# Patient Record
Sex: Female | Born: 1955 | Race: White | Hispanic: No | Marital: Married | Smoking: Never smoker
Health system: Southern US, Community
[De-identification: ages and names within clinical notes are randomized; demographics above are authoritative.]

## PROBLEM LIST (undated history)

## (undated) DIAGNOSIS — F329 Major depressive disorder, single episode, unspecified: Secondary | ICD-10-CM

## (undated) DIAGNOSIS — H269 Unspecified cataract: Secondary | ICD-10-CM

## (undated) DIAGNOSIS — C801 Malignant (primary) neoplasm, unspecified: Secondary | ICD-10-CM

## (undated) DIAGNOSIS — M419 Scoliosis, unspecified: Secondary | ICD-10-CM

## (undated) DIAGNOSIS — D649 Anemia, unspecified: Secondary | ICD-10-CM

## (undated) DIAGNOSIS — E559 Vitamin D deficiency, unspecified: Secondary | ICD-10-CM

## (undated) DIAGNOSIS — K449 Diaphragmatic hernia without obstruction or gangrene: Secondary | ICD-10-CM

## (undated) DIAGNOSIS — M4800 Spinal stenosis, site unspecified: Secondary | ICD-10-CM

## (undated) DIAGNOSIS — H9209 Otalgia, unspecified ear: Secondary | ICD-10-CM

## (undated) DIAGNOSIS — K829 Disease of gallbladder, unspecified: Secondary | ICD-10-CM

## (undated) DIAGNOSIS — R079 Chest pain, unspecified: Secondary | ICD-10-CM

## (undated) DIAGNOSIS — E042 Nontoxic multinodular goiter: Secondary | ICD-10-CM

## (undated) DIAGNOSIS — R05 Cough: Secondary | ICD-10-CM

## (undated) DIAGNOSIS — M7989 Other specified soft tissue disorders: Secondary | ICD-10-CM

## (undated) DIAGNOSIS — M5030 Other cervical disc degeneration, unspecified cervical region: Secondary | ICD-10-CM

## (undated) DIAGNOSIS — F32A Depression, unspecified: Secondary | ICD-10-CM

## (undated) DIAGNOSIS — M8580 Other specified disorders of bone density and structure, unspecified site: Secondary | ICD-10-CM

## (undated) DIAGNOSIS — G4730 Sleep apnea, unspecified: Secondary | ICD-10-CM

## (undated) DIAGNOSIS — M549 Dorsalgia, unspecified: Secondary | ICD-10-CM

## (undated) DIAGNOSIS — F419 Anxiety disorder, unspecified: Secondary | ICD-10-CM

## (undated) DIAGNOSIS — R059 Cough, unspecified: Secondary | ICD-10-CM

## (undated) DIAGNOSIS — K589 Irritable bowel syndrome without diarrhea: Secondary | ICD-10-CM

## (undated) DIAGNOSIS — E871 Hypo-osmolality and hyponatremia: Secondary | ICD-10-CM

## (undated) DIAGNOSIS — K5900 Constipation, unspecified: Secondary | ICD-10-CM

## (undated) DIAGNOSIS — N3010 Interstitial cystitis (chronic) without hematuria: Secondary | ICD-10-CM

## (undated) DIAGNOSIS — K219 Gastro-esophageal reflux disease without esophagitis: Secondary | ICD-10-CM

## (undated) DIAGNOSIS — M719 Bursopathy, unspecified: Secondary | ICD-10-CM

## (undated) DIAGNOSIS — M1990 Unspecified osteoarthritis, unspecified site: Secondary | ICD-10-CM

## (undated) DIAGNOSIS — I10 Essential (primary) hypertension: Secondary | ICD-10-CM

## (undated) HISTORY — DX: Interstitial cystitis (chronic) without hematuria: N30.10

## (undated) HISTORY — DX: Anemia, unspecified: D64.9

## (undated) HISTORY — DX: Unspecified cataract: H26.9

## (undated) HISTORY — DX: Scoliosis, unspecified: M41.9

## (undated) HISTORY — PX: DG SCOLIOSIS SERIES (ARMC HX): HXRAD1605

## (undated) HISTORY — DX: Spinal stenosis, site unspecified: M48.00

## (undated) HISTORY — DX: Sleep apnea, unspecified: G47.30

## (undated) HISTORY — DX: Malignant (primary) neoplasm, unspecified: C80.1

## (undated) HISTORY — DX: Depression, unspecified: F32.A

## (undated) HISTORY — PX: CHOLECYSTECTOMY: SHX55

## (undated) HISTORY — PX: TYMPANOSTOMY TUBE PLACEMENT: SHX32

## (undated) HISTORY — DX: Hypo-osmolality and hyponatremia: E87.1

## (undated) HISTORY — DX: Diaphragmatic hernia without obstruction or gangrene: K44.9

## (undated) HISTORY — DX: Anxiety disorder, unspecified: F41.9

## (undated) HISTORY — DX: Irritable bowel syndrome, unspecified: K58.9

## (undated) HISTORY — DX: Other specified soft tissue disorders: M79.89

## (undated) HISTORY — DX: Cough, unspecified: R05.9

## (undated) HISTORY — PX: COLOSTOMY CLOSURE: SHX1381

## (undated) HISTORY — DX: Constipation, unspecified: K59.00

## (undated) HISTORY — DX: Disease of gallbladder, unspecified: K82.9

## (undated) HISTORY — DX: Other specified disorders of bone density and structure, unspecified site: M85.80

## (undated) HISTORY — DX: Dorsalgia, unspecified: M54.9

## (undated) HISTORY — DX: Unspecified osteoarthritis, unspecified site: M19.90

## (undated) HISTORY — DX: Essential (primary) hypertension: I10

## (undated) HISTORY — DX: Major depressive disorder, single episode, unspecified: F32.9

## (undated) HISTORY — DX: Other cervical disc degeneration, unspecified cervical region: M50.30

## (undated) HISTORY — PX: PELVIC ABCESS DRAINAGE: SHX2189

## (undated) HISTORY — DX: Gastro-esophageal reflux disease without esophagitis: K21.9

## (undated) HISTORY — PX: COLOSTOMY: SHX63

## (undated) HISTORY — DX: Vitamin D deficiency, unspecified: E55.9

## (undated) HISTORY — PX: OTHER SURGICAL HISTORY: SHX169

## (undated) HISTORY — DX: Otalgia, unspecified ear: H92.09

## (undated) HISTORY — DX: Nontoxic multinodular goiter: E04.2

## (undated) HISTORY — DX: Bursopathy, unspecified: M71.9

## (undated) HISTORY — PX: TONSILECTOMY, ADENOIDECTOMY, BILATERAL MYRINGOTOMY AND TUBES: SHX2538

## (undated) HISTORY — PX: NASAL SEPTUM SURGERY: SHX37

## (undated) HISTORY — DX: Cough: R05

## (undated) HISTORY — PX: INTERSTIM IMPLANT PLACEMENT: SHX5130

## (undated) HISTORY — PX: HERNIA REPAIR: SHX51

## (undated) HISTORY — PX: COLOSTOMY REVERSAL: SHX5782

## (undated) HISTORY — DX: Chest pain, unspecified: R07.9

## (undated) HISTORY — PX: APPENDECTOMY: SHX54

---

## 2014-03-29 ENCOUNTER — Other Ambulatory Visit: Payer: Self-pay | Admitting: Orthopedic Surgery

## 2014-03-29 DIAGNOSIS — M5416 Radiculopathy, lumbar region: Secondary | ICD-10-CM

## 2014-04-03 ENCOUNTER — Other Ambulatory Visit: Payer: Self-pay | Admitting: Orthopedic Surgery

## 2014-04-03 DIAGNOSIS — M4316 Spondylolisthesis, lumbar region: Secondary | ICD-10-CM

## 2014-04-05 ENCOUNTER — Ambulatory Visit
Admission: RE | Admit: 2014-04-05 | Discharge: 2014-04-05 | Disposition: A | Discharge status: Home / Self Care | Payer: BC Managed Care – PPO | Source: Ambulatory Visit | Attending: Orthopedic Surgery | Admitting: Orthopedic Surgery

## 2014-04-05 DIAGNOSIS — M4316 Spondylolisthesis, lumbar region: Secondary | ICD-10-CM

## 2014-05-08 ENCOUNTER — Other Ambulatory Visit: Payer: Self-pay | Admitting: Orthopedic Surgery

## 2014-05-08 DIAGNOSIS — M5416 Radiculopathy, lumbar region: Secondary | ICD-10-CM

## 2014-05-31 ENCOUNTER — Other Ambulatory Visit: Payer: Self-pay | Admitting: Family Medicine

## 2014-05-31 ENCOUNTER — Ambulatory Visit
Admission: RE | Admit: 2014-05-31 | Discharge: 2014-05-31 | Disposition: A | Discharge status: Home / Self Care | Payer: BC Managed Care – PPO | Source: Ambulatory Visit | Attending: Orthopedic Surgery | Admitting: Orthopedic Surgery

## 2014-05-31 DIAGNOSIS — M5416 Radiculopathy, lumbar region: Secondary | ICD-10-CM

## 2014-05-31 MED ORDER — METHYLPREDNISOLONE ACETATE 40 MG/ML INJ SUSP (RADIOLOG
120.0000 mg | Freq: Once | INTRAMUSCULAR | Status: AC
  Administered 2014-05-31: 120 mg via EPIDURAL

## 2014-05-31 MED ORDER — IOHEXOL 180 MG/ML  SOLN
1.0000 mL | Freq: Once | INTRAMUSCULAR | Status: AC | PRN
  Administered 2014-05-31: 1 mL via EPIDURAL

## 2014-05-31 NOTE — Discharge Instructions (Signed)

## 2016-05-28 ENCOUNTER — Ambulatory Visit (INDEPENDENT_AMBULATORY_CARE_PROVIDER_SITE_OTHER): Payer: BC Managed Care – PPO | Admitting: Licensed Clinical Social Worker

## 2016-05-28 DIAGNOSIS — M1990 Unspecified osteoarthritis, unspecified site: Secondary | ICD-10-CM | POA: Insufficient documentation

## 2016-05-28 DIAGNOSIS — I10 Essential (primary) hypertension: Secondary | ICD-10-CM | POA: Insufficient documentation

## 2016-05-28 DIAGNOSIS — F418 Other specified anxiety disorders: Secondary | ICD-10-CM

## 2016-05-28 DIAGNOSIS — J45909 Unspecified asthma, uncomplicated: Secondary | ICD-10-CM | POA: Insufficient documentation

## 2016-05-28 DIAGNOSIS — K219 Gastro-esophageal reflux disease without esophagitis: Secondary | ICD-10-CM | POA: Insufficient documentation

## 2016-05-28 NOTE — Progress Notes (Signed)
Comprehensive Clinical Assessment (CCA) Note  05/28/2016 Jenna Chang [id number]  Visit Diagnosis:      ICD-9-CM ICD-10-CM   1. Situational anxiety 300.09 F41.8       CCA Part One  Part One has been completed on paper by the patient.  (See scanned document in Chart Review)  CCA Part Two A  Intake/Chief Complaint:  CCA Intake With Chief Complaint CCA Part Two Date: 05/28/16 CCA Part Two Time: 1206 Chief Complaint/Presenting Problem: "I worry about my daughter." Patients Currently Reported Symptoms/Problems: Patient has suspected her daughter, Jenna Chang (31) has bipolar disorder.  She has never been diagnosed, but her father has it.    Patient notes "It's emotionally draining to deal with her emotional outbursts."   Sometimes when she is stressed she overeats.       Collateral Involvement: Patient's husband, Bryn Gulling was also present for the assessment Individual's Strengths: Caring, likes to do things for others  Supportive husband and friends Type of Services Patient Feels Are Needed: NOt sure Initial Clinical Notes/Concerns: No history of MH problems  Mental Health Symptoms Depression:  Depression: Increase/decrease in appetite  Mania:  Mania: N/A  Anxiety:   Anxiety: Worrying, Tension  Psychosis:  Psychosis: N/A  Trauma:  Trauma: N/A  Obsessions:  Obsessions: N/A  Compulsions:  Compulsions: N/A  Inattention:  Inattention: N/A  Hyperactivity/Impulsivity:  Hyperactivity/Impulsivity: N/A  Oppositional/Defiant Behaviors:  Oppositional/Defiant Behaviors: N/A  Borderline Personality:  Emotional Irregularity: N/A  Other Mood/Personality Symptoms:      Mental Status Exam Appearance and self-care  Stature:  Stature: Average  Weight:  Weight: Overweight  Clothing:  Clothing: Neat/clean  Grooming:  Grooming: Normal  Cosmetic use:  Cosmetic Use: Age appropriate  Posture/gait:  Posture/Gait: Normal  Motor activity:  Motor Activity: Not Remarkable  Sensorium  Attention:   Attention: Normal  Concentration:  Concentration: Normal  Orientation:  Orientation: X5  Recall/memory:  Recall/Memory: Normal  Affect and Mood  Affect:  Affect: Appropriate  Mood:  Mood: Euthymic  Relating  Eye contact:  Eye Contact: Normal  Facial expression:  Facial Expression: Responsive  Attitude toward examiner:  Attitude Toward Examiner: Cooperative  Thought and Language  Speech flow: Speech Flow: Normal  Thought content:     Preoccupation:     Hallucinations:     Organization:     Transport planner of Knowledge:  Fund of Knowledge: Average  Intelligence:  Intelligence: Average  Abstraction:  Abstraction: Normal  Judgement:  Judgement: Normal  Reality Testing:  Reality Testing: Adequate  Insight:  Insight: Good  Decision Making:  Decision Making: Normal  Social Functioning  Social Maturity:  Social Maturity: Responsible  Social Judgement:  Social Judgement: Normal  Stress  Stressors:  Stressors: Family conflict  Coping Ability:  Coping Ability: Normal  Skill Deficits:     Supports:      Family and Psychosocial History: Family history Marital status: Married (Previously married for 13 years to [place].  He has bipolar disorder.  ) Number of Years Married: 1 Additional relationship information: Together since Oct 2015.  They knew eachother for about 15 years.   Does patient have children?: Yes How many children?: 2 How is patient's relationship with their children?: Son (34)-lives in [place], good relationship, talk frequently  Daughter, (31)-  relationship can be very good, or the opposite  Childhood History:  Childhood History By whom was/is the patient raised?: Both parents Additional childhood history information: Had scoliosis Description of patient's relationship with caregiver when they were a child: "  i had the best parents ever" Patient's description of current relationship with people who raised him/her: Dad died 10-18-2012.    Mom died Feb 18, 2015. 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 Has patient ever been in the [employer]?: No  Education: Education Did Teacher, adult education From [employer]?: Yes Did Physicist, medical?: Yes Did Heritage manager?: Yes What is Your Teacher, English as a foreign language Degree?: Special Education Teaching Did You Have Any Difficulty At [employer]?: No  Religion: Religion/Spirituality Are You A Religious Person?: Yes (Would like to find a new church) What is Your Religious Affiliation?: Personal assistant: Leisure / Recreation Leisure and Hobbies: Likes to read, spend time with family, travel   Exercise/Diet: Exercise/Diet Do You Exercise?: No Have You Gained or Lost A Significant Amount of Weight in the Past Six Months?: No Do You Follow a Special Diet?: No Do You Have Any Trouble Sleeping?: No  CCA Part Two C  Alcohol/Drug Use: Alcohol / Drug Use History of alcohol / drug use?: No history of alcohol / drug abuse                      CCA Part Three  ASAM's:  Six Dimensions of Multidimensional Assessment  Dimension 1:  Acute Intoxication and/or Withdrawal Potential:     Dimension 2:  Biomedical Conditions and Complications:     Dimension 3:  Emotional, Behavioral, or Cognitive Conditions and Complications:      Dimension 4:  Readiness to Change:     Dimension 5:  Relapse, Continued use, or Continued Problem Potential:     Dimension 6:  Recovery/Living Environment:      Substance use Disorder (SUD)    Social Function:  Social Functioning Social Maturity: Responsible Social Judgement: Normal  Stress:  Stress Stressors: Family conflict Coping Ability: Normal Patient Takes Medications The Way The Doctor Instructed?: Yes  Risk Assessment- Self-Harm Potential: Risk Assessment For Self-Harm Potential Thoughts of Self-Harm: No current thoughts Additional Comments for Self-Harm Potential: Denies history of harm to self  Risk Assessment -Dangerous to Others Potential: Risk Assessment For Dangerous to Others Potential Method: No Plan Additional Comments for Danger to Others Potential: Denies history of harm to others  DSM5 Diagnoses: Patient Active Problem List   Diagnosis Date Noted  . Asthma 05/28/2016  . Benign essential hypertension 05/28/2016  . GERD (gastroesophageal reflux disease) 05/28/2016  . Osteoarthritis 05/28/2016      Recommendations for Services/Supports/Treatments: Recommendations for Services/Supports/Treatments Recommendations For Services/Supports/Treatments:  (At patient's request provided her with the titles and authors of books about bipolar disorder)   No MH services recommended at this time.  Patient appears to be coping with life stressors effectively and she has sources of support in her life.    Garnette Scheuermann

## 2017-02-08 ENCOUNTER — Encounter (INDEPENDENT_AMBULATORY_CARE_PROVIDER_SITE_OTHER): Payer: BC Managed Care – PPO

## 2017-03-07 ENCOUNTER — Encounter (INDEPENDENT_AMBULATORY_CARE_PROVIDER_SITE_OTHER): Payer: Self-pay | Admitting: Family Medicine

## 2017-03-07 ENCOUNTER — Ambulatory Visit (INDEPENDENT_AMBULATORY_CARE_PROVIDER_SITE_OTHER): Payer: BC Managed Care – PPO | Admitting: Family Medicine

## 2017-03-07 VITALS — BP 153/96 | HR 68 | Temp 97.8°F | Ht 63.0 in | Wt 232.0 lb

## 2017-03-07 DIAGNOSIS — D649 Anemia, unspecified: Secondary | ICD-10-CM | POA: Insufficient documentation

## 2017-03-07 DIAGNOSIS — Z6841 Body Mass Index (BMI) 40.0 and over, adult: Secondary | ICD-10-CM

## 2017-03-07 DIAGNOSIS — D508 Other iron deficiency anemias: Secondary | ICD-10-CM

## 2017-03-07 DIAGNOSIS — I10 Essential (primary) hypertension: Secondary | ICD-10-CM

## 2017-03-07 DIAGNOSIS — E559 Vitamin D deficiency, unspecified: Secondary | ICD-10-CM | POA: Diagnosis not present

## 2017-03-07 DIAGNOSIS — R9431 Abnormal electrocardiogram [ECG] [EKG]: Secondary | ICD-10-CM | POA: Diagnosis not present

## 2017-03-07 DIAGNOSIS — Z1331 Encounter for screening for depression: Secondary | ICD-10-CM | POA: Diagnosis not present

## 2017-03-07 DIAGNOSIS — R5383 Other fatigue: Secondary | ICD-10-CM | POA: Insufficient documentation

## 2017-03-07 DIAGNOSIS — R0602 Shortness of breath: Secondary | ICD-10-CM

## 2017-03-07 DIAGNOSIS — Z0289 Encounter for other administrative examinations: Secondary | ICD-10-CM

## 2017-03-07 NOTE — Progress Notes (Signed)
.  Office: [phone]  /  Fax: [phone]   HPI:   Chief Complaint: OBESITY  Jenna Chang (MR# [id number]) is a 61 y.o. female who presents on 03/07/2017 for obesity evaluation and treatment. Current BMI is Body mass index is 41.1 kg/m.Marland Kitchen Jenna Chang has struggled with obesity for years and has been unsuccessful in either losing weight or maintaining long term weight loss. Jenna Chang attended our information session and states she is currently in the action stage of change and ready to dedicate time achieving and maintaining a healthier weight.   Jenna Chang states her family eats meals together she thinks her family will eat healthier with  her her desired weight loss is 92 lbs she started gaining weight after her first marriage separation her heaviest weight ever was 232 lbs. she frequently makes poor food choices she has problems with excessive hunger  she frequently eats larger portions than normal  she struggles with emotional eating    Fatigue Aviela feels her energy is lower than it should be. This has worsened with weight gain and has not worsened recently. Aiysha admits to daytime somnolence and  admits to waking up still tired. Patient is at risk for obstructive sleep apnea. Patent has a history of symptoms of morning headache. Patient generally gets 7 hours of sleep per night, and states they generally have difficulty falling asleep. Snoring is not present since she started using a CPAP. Apneic episodes are present and she has had a sleep study. Epworth Sleepiness Score is 5.  Dyspnea on exertion Jenna Chang notes increasing shortness of breath with exercising and seems to be worsening over time with weight gain. She notes getting out of breath sooner with activity than she used to. This has not gotten worse recently. Chyanna denies orthopnea.  Hypertension Jenna Chang is a 61 y.o. female with hypertension. Jenna Chang denies chest pain. She is working weight loss to help control her  blood pressure with the goal of decreasing her risk of heart attack and stroke. Jenna Chang's blood pressure is elevated today and she didn'Jenna Chang take her medication, as she is fasting.  Vitamin D deficiency Jenna Chang has a diagnosis of vitamin D deficiency. She is not currently taking Vit D and she notes fatigue but denies nausea, vomiting or muscle weakness.  Iron Deficiency Anemia Jenna Chang has a diagnosis of iron deficiency anemia.  She notes that her levels have not been checked in a while and notes fatigue. She is not on iron supplementation.   Abnormal EKG Jenna Chang was seen by cardiology and she states "nothing was wrong". She denies chest pain but notes shortness of breath with activity.  Depression Screen Jenna Chang's Food and Chang (modified PHQ-9) score was  Depression screen PHQ 2/9 03/07/2017  Decreased Interest 3  Down, Depressed, Hopeless 0  PHQ - 2 Score 3  Altered sleeping 3  Tired, decreased energy 3  Change in appetite 2  Feeling bad or failure about yourself  1  Trouble concentrating 0  Moving slowly or fidgety/restless 2  Suicidal thoughts 0  PHQ-9 Score 14  Difficult doing work/chores Somewhat difficult  Some encounter information is confidential and restricted. Go to Review Flowsheets activity to see all data.    ALLERGIES: Allergies  Allergen Reactions  . Trovafloxacin Anaphylaxis, Itching and Swelling    IV dosing only Tolerates levaquin without problems.  . Cefdinir Nausea And Vomiting  . Amlodipine Swelling  . Amlodipine Besylate Swelling  . Hydrochlorothiazide   . Losartan Cough  . Lisinopril Cough  MEDICATIONS: Current Outpatient Prescriptions on File Prior to Visit  Medication Sig Dispense Refill  . cetirizine (ZYRTEC) 10 MG tablet Take by mouth.    . cholecalciferol (VITAMIN D) 1000 units tablet Take by mouth 2 (two) times daily.     Marland Kitchen HYDROcodone-acetaminophen (NORCO) 7.5-325 MG tablet Take 1 tablet by mouth every 6 (six) hours as needed.     Marland Kitchen  levocetirizine (XYZAL) 5 MG tablet Take 5 mg by mouth.    . mirabegron ER (MYRBETRIQ) 50 MG TB24 tablet Take by mouth.    . pantoprazole (PROTONIX) 40 MG tablet Take by mouth 2 (two) times daily.     . traZODone (DESYREL) 50 MG tablet Take 1-2 tablets nightly at hour of sleep    . valACYclovir (VALTREX) 1000 MG tablet take 1 tablet by mouth twice a day if needed     No current facility-administered medications on file prior to visit.     PAST MEDICAL HISTORY: Past Medical History:  Diagnosis Date  . Acid reflux   . Anemia   . Anxiety   . Back pain   . Bilateral swelling of feet   . Bursitis   . Cataract   . Chest pain   . Constipation   . Cough   . DDD (degenerative disc disease), cervical   . Depression   . Ear pain   . Gallbladder disease   . GERD (gastroesophageal reflux disease)   . Hiatal hernia   . HTN (hypertension)   . IBS (irritable bowel syndrome)   . IC (interstitial cystitis)   . Low sodium levels   . Multinodular goiter   . OA (osteoarthritis)   . Osteopenia   . Scoliosis   . Sleep apnea   . Spinal stenosis   . Vitamin D deficiency     PAST SURGICAL HISTORY: Past Surgical History:  Procedure Laterality Date  . APPENDECTOMY    . CESAREAN SECTION    . CHOLECYSTECTOMY    . COLOSTOMY    . COLOSTOMY CLOSURE    . COLOSTOMY REVERSAL    . DG SCOLIOSIS SERIES ([place] [facility])     rods placed  . HERNIA REPAIR    . INTERSTIM IMPLANT PLACEMENT    . NASAL SEPTUM SURGERY    . PELVIC ABCESS DRAINAGE    . TONSILECTOMY, ADENOIDECTOMY, BILATERAL MYRINGOTOMY AND TUBES    . TYMPANOSTOMY TUBE PLACEMENT    . vascular leg      SOCIAL HISTORY: Social History  Substance Use Topics  . Smoking status: Not on file  . Smokeless tobacco: Not on file  . Alcohol use Not on file    FAMILY HISTORY: Family History  Problem Relation Age of Onset  . Hypertension Mother   . Heart disease Mother   . Stroke Mother   . Cancer Mother   . Anxiety disorder Mother   . Sleep  apnea Mother   . Hyperlipidemia Father   . Hypertension Father   . Heart disease Father   . Thyroid disease Father   . Kidney disease Father   . Cancer Father     ROS: Review of Systems  Constitutional: Positive for malaise/fatigue. Negative for weight loss.       Weakness Trouble sleeping (sometimes)  HENT: Positive for ear pain.        Stuffiness (year around) Discharge (year around) Difficult or painful swallowing (esophagus stretched 9/20) Hoarseness (sometimes)  Eyes:       Vision changes (uncreased far sightedness)  Wear glasses  Respiratory: Positive for cough and shortness of breath (with exertion).   Cardiovascular: Negative for chest pain and orthopnea.       Calf/leg pain with walking Leg cramping Very cold feet or hands  Gastrointestinal: Positive for heartburn. Negative for nausea and vomiting.       Swallowing difficulty  Genitourinary: Positive for frequency.       Excessive thirst  Musculoskeletal: Positive for back pain and neck pain.       Muscle or joint pain Negative muscle weakness  Skin: Positive for rash.       Hair or nail changes (black lines underneath nails)  Neurological: Positive for headaches.  Endo/Heme/Allergies: Bruises/bleeds easily.  Psychiatric/Behavioral: Positive for depression (situational).    PHYSICAL EXAM: Blood pressure (!) 153/96, pulse 68, temperature 97.8 F (36.6 C), temperature source Oral, height 5\' 3"  (1.6 m), weight 232 lb (105.2 kg), SpO2 96 %. Body mass index is 41.1 kg/m. Physical Exam  Constitutional: She is oriented to person, place, and time. She appears well-developed and well-nourished.  Cardiovascular:  Murmur (grade 1/6 early systolic murmur) heard. Pulmonary/Chest: Effort normal.  Musculoskeletal: Normal range of motion.  Neurological: She is oriented to person, place, and time.  Skin: Skin is warm and dry.  Psychiatric: She has a normal Chang and affect.  Vitals reviewed.   RECENT LABS AND  TESTS: BMET No results found for: NA, K, CL, CO2, GLUCOSE, BUN, CREATININE, CALCIUM, GFRNONAA, GFRAA No results found for: HGBA1C No results found for: INSULIN CBC No results found for: WBC, RBC, HGB, HCT, PLT, MCV, MCH, MCHC, RDW, LYMPHSABS, MONOABS, EOSABS, BASOSABS Iron/TIBC/Ferritin/ %Sat No results found for: IRON, TIBC, FERRITIN, IRONPCTSAT Lipid Panel  No results found for: CHOL, TRIG, HDL, CHOLHDL, VLDL, LDLCALC, LDLDIRECT Hepatic Function Panel  No results found for: PROT, ALBUMIN, AST, ALT, ALKPHOS, BILITOT, BILIDIR, IBILI No results found for: TSH  ECG  shows NSR with a rate of 63 BPM INDIRECT CALORIMETER done today shows a VO2 of 249 and a REE of 1736. Her calculated basal metabolic rate is 1324 thus her basal metabolic rate is better than expected.    ASSESSMENT AND PLAN: Other fatigue - Plan: EKG 12-Lead, Vitamin B12, Hemoglobin A1c, Insulin, random, Folate, T3, TSH, T4, free  Shortness of breath on exertion  Essential hypertension - Plan: Comprehensive metabolic panel  Vitamin D deficiency - Plan: VITAMIN D 25 Hydroxy (Vit-D Deficiency, Fractures)  Other iron deficiency anemia - Plan: CBC With Differential, Anemia panel  Abnormal ECG - Plan: Lipid Panel With LDL/HDL Ratio  Depression screening  Class 3 severe obesity with serious comorbidity and body mass index (BMI) of 40.0 to 44.9 in adult, unspecified obesity type (HCC)  PLAN:  Fatigue Sebastiana was informed that her fatigue may be related to obesity, depression or many other causes. Labs will be ordered, and in the meanwhile Meigan has agreed to work on diet, exercise and weight loss to help with fatigue. Proper sleep hygiene was discussed including the need for 7-8 hours of quality sleep each night. A sleep study was not ordered based on symptoms and Epworth score.  Dyspnea on exertion Jenna Chang's shortness of breath appears to be obesity related and exercise induced. She has agreed to work on weight loss  and gradually increase exercise to treat her exercise induced shortness of breath. If Jenna Chang follows our instructions and loses weight without improvement of her shortness of breath, we will plan to refer to pulmonology. We will monitor this condition regularly. Jenna Chang agrees to this  plan.  Hypertension We discussed sodium restriction, working on healthy weight loss, diet, and a regular exercise program as the means to achieve improved blood pressure control. Jenna Chang agreed with this plan and agreed to follow up as directed. We will continue to monitor her blood pressure as well as her progress with the above lifestyle modifications. She will continue her medications as prescribed and will watch for signs of hypotension as she continues her lifestyle modifications. We will check labs and Jenna Chang agrees to follow up with our clinic in 2 weeks.  Vitamin D Deficiency Jenna Chang was informed that low vitamin D levels contributes to fatigue and are associated with obesity, breast, and colon cancer. She will follow up for routine testing of vitamin D, at least 2-3 times per year. She was informed of the risk of over-replacement of vitamin D and agrees to not increase her dose unless he discusses this with [place] first. We will check labs and Jenna Chang agrees to follow up with our clinic in 2 weeks.  Iron Deficiency Anemia The diagnosis of Iron deficiency anemia was discussed with Jenna Chang and was explained in detail. She was given suggestions of iron rich foods and and iron supplement was not prescribed. We will check labs and Jenna Chang agrees to follow up with our clinic in 2 weeks.  Abnormal EKG We will obtain medical records from [facility] and Jenna Chang agrees to follow up with our clinic in 2 weeks.  Depression Screen Jenna Chang had a moderately positive depression screening. Depression is commonly associated with obesity and often results in emotional eating behaviors. We will monitor this closely and work on CBT to  help improve the non-hunger eating patterns. Referral to Psychology may be required if no improvement is seen as she continues in our clinic.  Obesity Jenna Chang is currently in the action stage of change and her goal is to continue with weight loss efforts She has agreed to follow the Category 3 plan Jenna Chang has been instructed to work up to a goal of 150 minutes of combined cardio and strengthening exercise per week for weight loss and overall health benefits. We discussed the following Behavioral Modification Strategies today: increasing lean protein intake, decreasing simple carbohydrates, and work on meal planning and easy cooking plans  Maurice has agreed to follow up with our clinic in 2 weeks. She was informed of the importance of frequent follow up visits to maximize her success with intensive lifestyle modifications for her multiple health conditions. She was informed we would discuss her lab results at her next visit unless there is a critical issue that needs to be addressed sooner. Ishitha agreed to keep her next visit at the agreed upon time to discuss these results.  I, Trixie Dredge, am acting as transcriptionist for Dennard Nip, MD  I have reviewed the above documentation for accuracy and completeness, and I agree with the above. -Dennard Nip, MD       Today's visit was # 1 out of 22.  Starting weight: 232 lbs Starting date: 03/07/17 Today's weight : 232 lbs Today's date: 03/07/2017 Total lbs lost to date: 0 (Patients must lose 7 lbs in the first 6 months to continue with counseling)   ASK: We discussed the diagnosis of obesity with Jenna Chang today and Renesmay agreed to give [place] permission to discuss obesity behavioral modification therapy today.  ASSESS: Ivianna has the diagnosis of obesity and her BMI today is 41.11 Mabelle is in the action stage of change   ADVISE: Shereena was  educated on the multiple health risks of obesity as well as the benefit of weight loss  to improve her health. She was advised of the need for long term treatment and the importance of lifestyle modifications.  AGREE: Multiple dietary modification options and treatment options were discussed and  Maira agreed to follow the Category 3 plan We discussed the following Behavioral Modification Strategies today: increasing lean protein intake, decreasing simple carbohydrates, and work on meal planning and easy cooking plans

## 2017-03-08 LAB — COMPREHENSIVE METABOLIC PANEL
A/G RATIO: 1.7 (ref 1.2–2.2)
ALBUMIN: 3.8 g/dL (ref 3.6–4.8)
ALT: 12 IU/L (ref 0–32)
AST: 12 IU/L (ref 0–40)
Alkaline Phosphatase: 107 IU/L (ref 39–117)
BUN/Creatinine Ratio: 14 (ref 12–28)
BUN: 9 mg/dL (ref 8–27)
Bilirubin Total: 0.4 mg/dL (ref 0.0–1.2)
CO2: 26 mmol/L (ref 20–29)
Calcium: 9.2 mg/dL (ref 8.7–10.3)
Chloride: 95 mmol/L — ABNORMAL LOW (ref 96–106)
Creatinine, Ser: 0.64 mg/dL (ref 0.57–1.00)
GFR, EST AFRICAN AMERICAN: 112 mL/min/{1.73_m2} (ref >59)
GFR, EST NON AFRICAN AMERICAN: 97 mL/min/{1.73_m2} (ref >59)
Globulin, Total: 2.2 g/dL (ref 1.5–4.5)
Glucose: 88 mg/dL (ref 65–99)
POTASSIUM: 4.9 mmol/L (ref 3.5–5.2)
Sodium: 133 mmol/L — ABNORMAL LOW (ref 134–144)
Total Protein: 6 g/dL (ref 6.0–8.5)

## 2017-03-08 LAB — FOLATE

## 2017-03-08 LAB — CBC WITH DIFFERENTIAL
BASOS: 0 %
Basophils Absolute: 0 10*3/uL (ref 0.0–0.2)
EOS (ABSOLUTE): 0.2 10*3/uL (ref 0.0–0.4)
EOS: 3 %
Hemoglobin: 12.8 g/dL (ref 11.1–15.9)
Immature Grans (Abs): 0 10*3/uL (ref 0.0–0.1)
Immature Granulocytes: 0 %
LYMPHS ABS: 1.7 10*3/uL (ref 0.7–3.1)
LYMPHS: 27 %
MCH: 29.2 pg (ref 26.6–33.0)
MCHC: 33.4 g/dL (ref 31.5–35.7)
MCV: 87 fL (ref 79–97)
MONOS ABS: 0.3 10*3/uL (ref 0.1–0.9)
Monocytes: 5 %
NEUTROS PCT: 65 %
Neutrophils Absolute: 4 10*3/uL (ref 1.4–7.0)
RBC: 4.39 x10E6/uL (ref 3.77–5.28)
RDW: 13.8 % (ref 12.3–15.4)
WBC: 6.2 10*3/uL (ref 3.4–10.8)

## 2017-03-08 LAB — TSH: TSH: 3.44 u[IU]/mL (ref 0.450–4.500)

## 2017-03-08 LAB — ANEMIA PANEL
FOLATE, RBC: 1457 ng/mL (ref >498)
Ferritin: 17 ng/mL (ref 15–150)
Folate, Hemolysate: 558.2 ng/mL
Hematocrit: 38.3 % (ref 34.0–46.6)
IRON SATURATION: 18 % (ref 15–55)
IRON: 73 ug/dL (ref 27–159)
RETIC CT PCT: 1.5 % (ref 0.6–2.6)
TIBC: 401 ug/dL (ref 250–450)
UIBC: 328 ug/dL (ref 131–425)
Vitamin B-12: 1679 pg/mL — ABNORMAL HIGH (ref 232–1245)

## 2017-03-08 LAB — T4, FREE: Free T4: 1.3 ng/dL (ref 0.82–1.77)

## 2017-03-08 LAB — INSULIN, RANDOM: INSULIN: 7.8 u[IU]/mL (ref 2.6–24.9)

## 2017-03-08 LAB — HEMOGLOBIN A1C
Est. average glucose Bld gHb Est-mCnc: 103 mg/dL
Hgb A1c MFr Bld: 5.2 % (ref 4.8–5.6)

## 2017-03-08 LAB — LIPID PANEL WITH LDL/HDL RATIO
Cholesterol, Total: 183 mg/dL (ref 100–199)
HDL: 67 mg/dL (ref >39)
LDL Calculated: 98 mg/dL (ref 0–99)
LDl/HDL Ratio: 1.5 ratio (ref 0.0–3.2)
TRIGLYCERIDES: 89 mg/dL (ref 0–149)
VLDL Cholesterol Cal: 18 mg/dL (ref 5–40)

## 2017-03-08 LAB — VITAMIN D 25 HYDROXY (VIT D DEFICIENCY, FRACTURES): VIT D 25 HYDROXY: 60.7 ng/mL (ref 30.0–100.0)

## 2017-03-08 LAB — T3: T3, Total: 125 ng/dL (ref 71–180)

## 2017-03-21 ENCOUNTER — Ambulatory Visit (INDEPENDENT_AMBULATORY_CARE_PROVIDER_SITE_OTHER): Payer: BC Managed Care – PPO | Admitting: Family Medicine

## 2017-03-22 ENCOUNTER — Ambulatory Visit (INDEPENDENT_AMBULATORY_CARE_PROVIDER_SITE_OTHER): Payer: BC Managed Care – PPO | Admitting: Family Medicine

## 2017-03-22 VITALS — BP 109/74 | HR 70 | Temp 97.8°F | Ht 63.0 in | Wt 227.0 lb

## 2017-03-22 DIAGNOSIS — F509 Eating disorder, unspecified: Secondary | ICD-10-CM

## 2017-03-22 DIAGNOSIS — Z6841 Body Mass Index (BMI) 40.0 and over, adult: Secondary | ICD-10-CM

## 2017-03-22 DIAGNOSIS — R7989 Other specified abnormal findings of blood chemistry: Secondary | ICD-10-CM | POA: Insufficient documentation

## 2017-03-22 DIAGNOSIS — E8881 Metabolic syndrome: Secondary | ICD-10-CM

## 2017-03-22 MED ORDER — BUPROPION HCL ER (SR) 150 MG PO TB12: 150.0000 mg | ORAL_TABLET | Freq: Every day | ORAL | 0 refills | Status: DC

## 2017-03-22 NOTE — Progress Notes (Signed)
Office: [phone]  /  Fax: [phone]   HPI:   Chief Complaint: OBESITY Jenna Chang is here to discuss her progress with her obesity treatment plan. She is on the Category 3 plan and is following her eating plan approximately 95 % of the time. She states she is walking for 15 minutes 2 times per week. Jenna Chang continues to do well on the Category 3 plan but she is getting ready to travel and then go on vacation and she is worried about weight gain.  Her weight is 227 lb (103 kg) today and has had a weight loss of 5 pounds over a period of 2 weeks since her last visit. She has lost 5 lbs since starting treatment with [place].  Eating Disorder (Not otherwise Specified) Jatara notes she is struggling with emotional eating and cravings. She mostly eats for pleasure and not for hunger. She describes some binge eating and has an unhealthy relationship with food and her weight.  Elevated Vitamin B12  Mazelle has a diagnosis of elevated B12. She is on OTC Vitamin B12 and her level is now over-replaced. She denies nausea and vomiting.   Insulin Resistance Suvi has a diagnosis of insulin resistance based on her mildly elevated fasting insulin level >5 with normal glucose and A1c. Although Jailen's blood glucose readings are still under good control, insulin resistance puts her at greater risk of metabolic syndrome and diabetes. She is not taking metformin currently and continues to work on diet and exercise to decrease risk of diabetes. She notes polyphagia.  ALLERGIES: Allergies  Allergen Reactions  . Trovafloxacin Anaphylaxis, Itching and Swelling    IV dosing only Tolerates levaquin without problems.  . Cefdinir Nausea And Vomiting  . Amlodipine Swelling  . Amlodipine Besylate Swelling  . Hydrochlorothiazide   . Losartan Cough  . Lisinopril Cough    MEDICATIONS: Current Outpatient Medications on File Prior to Visit  Medication Sig Dispense Refill  . albuterol (PROVENTIL HFA;VENTOLIN HFA)  108 (90 Base) MCG/ACT inhaler Inhale into the lungs every 6 (six) hours as needed for wheezing or shortness of breath.    . brimonidine (ALPHAGAN) 0.2 % ophthalmic solution 3 (three) times daily.    . calcium-vitamin D 250-100 MG-UNIT tablet Take 1 tablet by mouth 2 (two) times daily.    . carvedilol (COREG) 6.25 MG tablet Take 12.5 mg 2 (two) times daily with a meal by mouth.     . cetirizine (ZYRTEC) 10 MG tablet Take by mouth.    . cholecalciferol (VITAMIN D) 1000 units tablet Take by mouth 2 (two) times daily.     . cyclobenzaprine (FLEXERIL) 5 MG tablet Take 5 mg by mouth 3 (three) times daily as needed for muscle spasms.    Marland Kitchen desmopressin (DDAVP) 0.2 MG tablet Take 0.2 mg by mouth daily.    . fluticasone (FLONASE) 50 MCG/ACT nasal spray Place 1 spray into both nostrils daily.    . furosemide (LASIX) 20 MG tablet Take 20 mg by mouth daily.    Marland Kitchen HYDROcodone-acetaminophen (NORCO) 7.5-325 MG tablet Take 1 tablet by mouth every 6 (six) hours as needed.     Marland Kitchen ipratropium (ATROVENT) 0.03 % nasal spray Place 2 sprays into both nostrils every 12 (twelve) hours.    Marland Kitchen levocetirizine (XYZAL) 5 MG tablet Take 5 mg by mouth.    . mirabegron ER (MYRBETRIQ) 50 MG TB24 tablet Take by mouth.    . Ospemifene (OSPHENA) 60 MG TABS Take 1 tablet by mouth daily.    Marland Kitchen  pantoprazole (PROTONIX) 40 MG tablet Take by mouth 2 (two) times daily.     . polycarbophil (FIBERCON) 625 MG tablet Take 1,250 mg by mouth daily.    . traZODone (DESYREL) 50 MG tablet Take 1-2 tablets nightly at hour of sleep    . valACYclovir (VALTREX) 1000 MG tablet take 1 tablet by mouth twice a day if needed    . verapamil (CALAN) 120 MG tablet Take 240 mg once by mouth.      No current facility-administered medications on file prior to visit.     PAST MEDICAL HISTORY: Past Medical History:  Diagnosis Date  . Acid reflux   . Anemia   . Anxiety   . Back pain   . Bilateral swelling of feet   . Bursitis   . Cataract   . Chest pain     . Constipation   . Cough   . DDD (degenerative disc disease), cervical   . Depression   . Ear pain   . Gallbladder disease   . GERD (gastroesophageal reflux disease)   . Hiatal hernia   . HTN (hypertension)   . IBS (irritable bowel syndrome)   . IC (interstitial cystitis)   . Low sodium levels   . Multinodular goiter   . OA (osteoarthritis)   . Osteopenia   . Scoliosis   . Sleep apnea   . Spinal stenosis   . Vitamin D deficiency     PAST SURGICAL HISTORY: Past Surgical History:  Procedure Laterality Date  . APPENDECTOMY    . CESAREAN SECTION    . CHOLECYSTECTOMY    . COLOSTOMY    . COLOSTOMY CLOSURE    . COLOSTOMY REVERSAL    . DG SCOLIOSIS SERIES ([facility])     rods placed  . HERNIA REPAIR    . INTERSTIM IMPLANT PLACEMENT    . NASAL SEPTUM SURGERY    . PELVIC ABCESS DRAINAGE    . TONSILECTOMY, ADENOIDECTOMY, BILATERAL MYRINGOTOMY AND TUBES    . TYMPANOSTOMY TUBE PLACEMENT    . vascular leg      SOCIAL HISTORY: Social History   Tobacco Use  . Smoking status: Not on file  Substance Use Topics  . Alcohol use: Not on file  . Drug use: Not on file    FAMILY HISTORY: Family History  Problem Relation Age of Onset  . Hypertension Mother   . Heart disease Mother   . Stroke Mother   . Cancer Mother   . Anxiety disorder Mother   . Sleep apnea Mother   . Hyperlipidemia Father   . Hypertension Father   . Heart disease Father   . Thyroid disease Father   . Kidney disease Father   . Cancer Father     ROS: Review of Systems  Constitutional: Positive for weight loss.  Endo/Heme/Allergies:       Positive polyphagia  Psychiatric/Behavioral:       Eating disorder    PHYSICAL EXAM: Blood pressure 109/74, pulse 70, temperature 97.8 F (36.6 C), temperature source Oral, height 5\' 3"  (1.6 m), weight 227 lb (103 kg), SpO2 98 %. Body mass index is 40.21 kg/m. Physical Exam  Constitutional: She is oriented to person, place, and time. She appears  well-developed and well-nourished.  Cardiovascular: Normal rate.  Pulmonary/Chest: Effort normal.  Musculoskeletal: Normal range of motion.  Neurological: She is oriented to person, place, and time.  Skin: Skin is warm and dry.  Psychiatric: She has a normal mood and affect.  Vitals  reviewed.   RECENT LABS AND TESTS: BMET    Component Value Date/Time   NA 133 (L) 03/07/2017 0930   K 4.9 03/07/2017 0930   CL 95 (L) 03/07/2017 0930   CO2 26 03/07/2017 0930   GLUCOSE 88 03/07/2017 0930   BUN 9 03/07/2017 0930   CREATININE 0.64 03/07/2017 0930   CALCIUM 9.2 03/07/2017 0930   GFRNONAA 97 03/07/2017 0930   GFRAA 112 03/07/2017 0930   Lab Results  Component Value Date   HGBA1C 5.2 03/07/2017   Lab Results  Component Value Date   INSULIN 7.8 03/07/2017   CBC    Component Value Date/Time   WBC 6.2 03/07/2017 0930   RBC 4.39 03/07/2017 0930   HGB 12.8 03/07/2017 0930   HCT 38.3 03/07/2017 0930   MCV 87 03/07/2017 0930   MCH 29.2 03/07/2017 0930   MCHC 33.4 03/07/2017 0930   RDW 13.8 03/07/2017 0930   LYMPHSABS 1.7 03/07/2017 0930   EOSABS 0.2 03/07/2017 0930   BASOSABS 0.0 03/07/2017 0930   Iron/TIBC/Ferritin/ %Sat    Component Value Date/Time   IRON 73 03/07/2017 0930   TIBC 401 03/07/2017 0930   FERRITIN 17 03/07/2017 0930   IRONPCTSAT 18 03/07/2017 0930   Lipid Panel     Component Value Date/Time   CHOL 183 03/07/2017 0930   TRIG 89 03/07/2017 0930   HDL 67 03/07/2017 0930   LDLCALC 98 03/07/2017 0930   Hepatic Function Panel     Component Value Date/Time   PROT 6.0 03/07/2017 0930   ALBUMIN 3.8 03/07/2017 0930   AST 12 03/07/2017 0930   ALT 12 03/07/2017 0930   ALKPHOS 107 03/07/2017 0930   BILITOT 0.4 03/07/2017 0930      Component Value Date/Time   TSH 3.440 03/07/2017 0930    ASSESSMENT AND PLAN: High serum vitamin B12  Insulin resistance  Eating disorder, unspecified  Class 3 severe obesity with serious comorbidity and body mass  index (BMI) of 40.0 to 44.9 in adult, unspecified obesity type ([place])  PLAN:  Eating Disorder (Not otherwise Specified) We discussed behavior modification techniques today to help Sanoe deal with her emotional eating. Jerzy agrees to start Wellbutrin SR 150 mg q AM #30 with no refills. Reghan agrees to follow up with our clinic in 2 weeks with our dietitian and follow up in 4 weeks with myself.  Elevated Vitamin B12 Ashia agrees to discontinue OTC Vitamin B12. We will recheck labs in 3 months and she will continue her high B12 diet. Francille agrees to follow up with our clinic in 2 weeks with our dietitian and follow up in 4 weeks with myself.  Insulin Resistance Belkys will continue to work on weight loss, diet, exercise, and decreasing simple carbohydrates in her diet to help decrease the risk of diabetes. We dicussed metformin including benefits and risks. She was informed that eating too many simple carbohydrates or too many calories at one sitting increases the likelihood of GI side effects. Giovanni declined metformin for now and prescription was not written today. We will recheck labs in 3 months. Veatrice agrees to follow up with our clinic in 2 weeks with our dietitian and follow up in 4 weeks with myself as directed to monitor her progress.  Obesity Ronin is currently in the action stage of change. As such, her goal is to continue with weight loss efforts She has agreed to follow the Category 3 plan Sherriann has been instructed to work up to a goal of  150 minutes of combined cardio and strengthening exercise per week for weight loss and overall health benefits. We discussed the following Behavioral Modification Strategies today: increasing lean protein intake, emotional eating strategies, decrease ETOH, increase H20 intake, keeping healthy foods in the home, and travel eating strategies.   Jodean has agreed to follow up with our clinic in 2 weeks with our dietitian and follow up in 4 weeks  with myself. She was informed of the importance of frequent follow up visits to maximize her success with intensive lifestyle modifications for her multiple health conditions.  I, Trixie Dredge, am acting as transcriptionist for Dennard Nip, MD  I have reviewed the above documentation for accuracy and completeness, and I agree with the above. -Dennard Nip, MD      Today's visit was # 2 out of 65.  Starting weight: 232 lbs Starting date: 03/07/17 Today's weight : 227 lbs  Today's date: 03/22/2017 Total lbs lost to date: 5 (Patients must lose 7 lbs in the first 6 months to continue with counseling)   ASK: We discussed the diagnosis of obesity with [name] today and Annaliyah agreed to give [place] permission to discuss obesity behavioral modification therapy today.  ASSESS: Tawania has the diagnosis of obesity and her BMI today is 40.22 Yenty is in the action stage of change   ADVISE: Khalise was educated on the multiple health risks of obesity as well as the benefit of weight loss to improve her health. She was advised of the need for long term treatment and the importance of lifestyle modifications.  AGREE: Multiple dietary modification options and treatment options were discussed and  Kensley agreed to follow the Category 3 plan We discussed the following Behavioral Modification Strategies today: increasing lean protein intake, emotional eating strategies, decrease ETOH, increase H20 intake, keeping healthy foods in the home, and travel eating strategies.

## 2017-04-05 ENCOUNTER — Ambulatory Visit (INDEPENDENT_AMBULATORY_CARE_PROVIDER_SITE_OTHER): Payer: BC Managed Care – PPO | Admitting: Dietician

## 2017-04-05 VITALS — Ht 63.0 in | Wt 222.0 lb

## 2017-04-05 DIAGNOSIS — Z6839 Body mass index (BMI) 39.0-39.9, adult: Secondary | ICD-10-CM

## 2017-04-05 DIAGNOSIS — Z9189 Other specified personal risk factors, not elsewhere classified: Secondary | ICD-10-CM | POA: Diagnosis not present

## 2017-04-05 DIAGNOSIS — E8881 Metabolic syndrome: Secondary | ICD-10-CM | POA: Diagnosis not present

## 2017-04-08 ENCOUNTER — Encounter (INDEPENDENT_AMBULATORY_CARE_PROVIDER_SITE_OTHER): Payer: Self-pay | Admitting: Family Medicine

## 2017-04-11 NOTE — Progress Notes (Signed)
  Office: [phone]  /  Fax: [phone]     Jenna Chang has a diagnosis of insulin resistance based on her elevated fasting insulin level >5. Although Jenna Chang's blood glucose readings are still under good control, insulin resistance puts her at greater risk of metabolic syndrome and diabetes. Jenna Chang is here for diabetic risk nutrition counseling.  Her weight today is 222 lbs. She has had a weight loss of 5 lbs since her last visit and has lost 10 lbs since beginning treatment with [place]. She is following our Category 3 meal plan 99% of the time. She states she was on vacation and planned for the category 3 which she states worked out very well. She denies any significant problems with cravings and states her hunger has been manageable. Today healthy eating for the Thanksgiving holiday was discussed in detail as well as  food nutrients ie protein, fats, simple and complex carbohydrates and how these affect insulin response. Focus on portion control,  avoiding simple carbohydrates and lower fat foods for ongoing wt loss efforts.   Jenna Chang is on the following meal plan: category 3 Her meal plan was individualized for maximum benefit.  Also discussed at length the following behavioral modifications to help maximize  success holiday eating strategies.   Jenna Chang has been instructed to work up to a goal of 150 minutes of combined cardio and strengthening exercise per week for weight loss and overall health benefits. Written information was provided and the following handouts were given: Holiday eating.   Office: [phone]  /  Fax: [phone]  OBESITY BEHAVIORAL INTERVENTION VISIT  Today's visit was # 3 out of 22.  Starting weight: 232 lbs Starting date: 03/07/17 Today's weight : Weight: 222 lb (100.7 kg)  Today's date:04/05/17 Total lbs lost to date: 10 (Patients must lose 7 lbs in the first 6 months to continue with counseling)   ASK: We discussed the diagnosis of obesity with Jenna Chang  today and Jenna Chang agreed to give [place] permission to discuss obesity behavioral modification therapy today.  ASSESS: Jenna Chang has the diagnosis of obesity and her BMI today is 39.34. Jenna Chang is in the action stage of change   ADVISE: Jenna Chang was educated on the multiple health risks of obesity as well as the benefit of weight loss to improve her health. She was advised of the need for long term treatment and the importance of lifestyle modifications.  AGREE: Multiple dietary modification options and treatment options were discussed and  Jenna Chang agreed to follow the Category 3 plan We discussed the following Behavioral Modification Stratagies today: increasing lean protein intake and holiday eating strategies

## 2017-04-18 ENCOUNTER — Ambulatory Visit (INDEPENDENT_AMBULATORY_CARE_PROVIDER_SITE_OTHER): Payer: BC Managed Care – PPO | Admitting: Family Medicine

## 2017-04-18 VITALS — BP 109/71 | HR 72 | Temp 98.2°F | Ht 63.0 in | Wt 221.0 lb

## 2017-04-18 DIAGNOSIS — Z6839 Body mass index (BMI) 39.0-39.9, adult: Secondary | ICD-10-CM

## 2017-04-18 DIAGNOSIS — F3289 Other specified depressive episodes: Secondary | ICD-10-CM

## 2017-04-18 DIAGNOSIS — E8881 Metabolic syndrome: Secondary | ICD-10-CM

## 2017-04-18 MED ORDER — BUPROPION HCL ER (SR) 200 MG PO TB12: 200.0000 mg | ORAL_TABLET | Freq: Every day | ORAL | 0 refills | Status: DC

## 2017-04-18 NOTE — Progress Notes (Signed)
Office: [phone]  /  Fax: [phone]   HPI:   Chief Complaint: OBESITY Jenna Chang is here to discuss her progress with her obesity treatment plan. She is on the Category 3 plan and is following her eating plan approximately 95 % of the time. She states she is walking for 30 minutes 3 to 4 times per week. Jenna Chang continues to do well with weight loss, even over Thanksgiving. She states hunger is mostly controlled and she is doing well with exercise. Her weight is 221 lb (100.2 kg) today and has had a weight loss of 1 pound over a period of 4 weeks since her last visit. She has lost 11 lbs since starting treatment with [place].  Insulin Resistance Jenna Chang has a diagnosis of insulin resistance based on her elevated fasting insulin level >5. Although Jenna Chang's blood glucose readings are still under good control, insulin resistance puts her at greater risk of metabolic syndrome and diabetes. Jenna Chang was struggling to follow the plan over Thanksgiving with increased simple carbohydrates, but she tried to control her portions. Jenna Chang is not taking metformin currently and she continues to work on diet and exercise to decrease risk of diabetes.  Depression with emotional eating behaviors Jenna Chang is tearful in the office today about only losing 1 pound over Thanksgiving, even though she had to deviate from her plan with recent TMJ diagnosis. Jenna Chang struggles with emotional eating and using food for comfort to the extent that it is negatively impacting her health. She often snacks when she is not hungry. Jenna Chang sometimes feels she is out of control and then feels guilty that she made poor food choices. She has been working on behavior modification techniques to help reduce her emotional eating and has been somewhat successful. She shows no sign of suicidal or homicidal ideations.  Depression screen PHQ 2/9 03/07/2017  Decreased Interest 3  Down, Depressed, Hopeless 0  PHQ - 2 Score 3  Altered sleeping 3    Tired, decreased energy 3  Change in appetite 2  Feeling bad or failure about yourself  1  Trouble concentrating 0  Moving slowly or fidgety/restless 2  Suicidal thoughts 0  PHQ-9 Score 14  Difficult doing work/chores Somewhat difficult  Some encounter information is confidential and restricted. Go to Review Flowsheets activity to see all data.      ALLERGIES: Allergies  Allergen Reactions  . Trovafloxacin Anaphylaxis, Itching and Swelling    IV dosing only Tolerates levaquin without problems.  . Cefdinir Nausea And Vomiting  . Amlodipine Swelling  . Amlodipine Besylate Swelling  . Hydrochlorothiazide   . Losartan Cough  . Lisinopril Cough    MEDICATIONS: Current Outpatient Medications on File Prior to Visit  Medication Sig Dispense Refill  . albuterol (PROVENTIL HFA;VENTOLIN HFA) 108 (90 Base) MCG/ACT inhaler Inhale into the lungs every 6 (six) hours as needed for wheezing or shortness of breath.    . brimonidine (ALPHAGAN) 0.2 % ophthalmic solution 3 (three) times daily.    . calcium-vitamin D 250-100 MG-UNIT tablet Take 1 tablet by mouth 2 (two) times daily.    . carvedilol (COREG) 6.25 MG tablet Take 12.5 mg 2 (two) times daily with a meal by mouth.     . cetirizine (ZYRTEC) 10 MG tablet Take by mouth.    . cholecalciferol (VITAMIN D) 1000 units tablet Take by mouth 2 (two) times daily.     . cyclobenzaprine (FLEXERIL) 5 MG tablet Take 5 mg by mouth 3 (three) times daily as needed for muscle  spasms.    Marland Kitchen desmopressin (DDAVP) 0.2 MG tablet Take 0.2 mg by mouth daily.    . fluticasone (FLONASE) 50 MCG/ACT nasal spray Place 1 spray into both nostrils daily.    . furosemide (LASIX) 20 MG tablet Take 20 mg by mouth daily.    Marland Kitchen HYDROcodone-acetaminophen (NORCO) 7.5-325 MG tablet Take 1 tablet by mouth every 6 (six) hours as needed.     Marland Kitchen ipratropium (ATROVENT) 0.03 % nasal spray Place 2 sprays into both nostrils every 12 (twelve) hours.    Marland Kitchen levocetirizine (XYZAL) 5 MG  tablet Take 5 mg by mouth.    . mirabegron ER (MYRBETRIQ) 50 MG TB24 tablet Take by mouth.    . Ospemifene (OSPHENA) 60 MG TABS Take 1 tablet by mouth daily.    . pantoprazole (PROTONIX) 40 MG tablet Take by mouth 2 (two) times daily.     . polycarbophil (FIBERCON) 625 MG tablet Take 1,250 mg by mouth daily.    . traZODone (DESYREL) 50 MG tablet Take 1-2 tablets nightly at hour of sleep    . valACYclovir (VALTREX) 1000 MG tablet take 1 tablet by mouth twice a Jenna Chang if needed    . verapamil (CALAN) 120 MG tablet Take 240 mg once by mouth.      No current facility-administered medications on file prior to visit.     PAST MEDICAL HISTORY: Past Medical History:  Diagnosis Date  . Acid reflux   . Anemia   . Anxiety   . Back pain   . Bilateral swelling of feet   . Bursitis   . Cataract   . Chest pain   . Constipation   . Cough   . DDD (degenerative disc disease), cervical   . Depression   . Ear pain   . Gallbladder disease   . GERD (gastroesophageal reflux disease)   . Hiatal hernia   . HTN (hypertension)   . IBS (irritable bowel syndrome)   . IC (interstitial cystitis)   . Low sodium levels   . Multinodular goiter   . OA (osteoarthritis)   . Osteopenia   . Scoliosis   . Sleep apnea   . Spinal stenosis   . Vitamin D deficiency     PAST SURGICAL HISTORY: Past Surgical History:  Procedure Laterality Date  . APPENDECTOMY    . CESAREAN SECTION    . CHOLECYSTECTOMY    . COLOSTOMY    . COLOSTOMY CLOSURE    . COLOSTOMY REVERSAL    . DG SCOLIOSIS SERIES ([place] HX)     rods placed  . HERNIA REPAIR    . INTERSTIM IMPLANT PLACEMENT    . NASAL SEPTUM SURGERY    . PELVIC ABCESS DRAINAGE    . TONSILECTOMY, ADENOIDECTOMY, BILATERAL MYRINGOTOMY AND TUBES    . TYMPANOSTOMY TUBE PLACEMENT    . vascular leg      SOCIAL HISTORY: Social History   Tobacco Use  . Smoking status: Not on file  Substance Use Topics  . Alcohol use: Not on file  . Drug use: Not on file    FAMILY  HISTORY: Family History  Problem Relation Age of Onset  . Hypertension Mother   . Heart disease Mother   . Stroke Mother   . Cancer Mother   . Anxiety disorder Mother   . Sleep apnea Mother   . Hyperlipidemia Father   . Hypertension Father   . Heart disease Father   . Thyroid disease Father   . Kidney disease Father   .  Cancer Father     ROS: Review of Systems  Constitutional: Positive for weight loss.  Psychiatric/Behavioral: Positive for depression. Negative for suicidal ideas.    PHYSICAL EXAM: Blood pressure 109/71, pulse 72, temperature 98.2 F (36.8 C), temperature source Oral, height 5\' 3"  (1.6 m), weight 221 lb (100.2 kg), SpO2 96 %. Body mass index is 39.15 kg/m. Physical Exam  Constitutional: She is oriented to person, place, and time. She appears well-developed and well-nourished.  Cardiovascular: Normal rate.  Pulmonary/Chest: Effort normal.  Musculoskeletal: Normal range of motion.  Neurological: She is oriented to person, place, and time.  Skin: Skin is warm and dry.  Vitals reviewed.   RECENT LABS AND TESTS: BMET    Component Value Date/Time   NA 133 (L) 03/07/2017 0930   K 4.9 03/07/2017 0930   CL 95 (L) 03/07/2017 0930   CO2 26 03/07/2017 0930   GLUCOSE 88 03/07/2017 0930   BUN 9 03/07/2017 0930   CREATININE 0.64 03/07/2017 0930   CALCIUM 9.2 03/07/2017 0930   GFRNONAA 97 03/07/2017 0930   GFRAA 112 03/07/2017 0930   Lab Results  Component Value Date   HGBA1C 5.2 03/07/2017   Lab Results  Component Value Date   INSULIN 7.8 03/07/2017   CBC    Component Value Date/Time   WBC 6.2 03/07/2017 0930   RBC 4.39 03/07/2017 0930   HGB 12.8 03/07/2017 0930   HCT 38.3 03/07/2017 0930   MCV 87 03/07/2017 0930   MCH 29.2 03/07/2017 0930   MCHC 33.4 03/07/2017 0930   RDW 13.8 03/07/2017 0930   LYMPHSABS 1.7 03/07/2017 0930   EOSABS 0.2 03/07/2017 0930   BASOSABS 0.0 03/07/2017 0930   Iron/TIBC/Ferritin/ %Sat    Component Value Date/Time    IRON 73 03/07/2017 0930   TIBC 401 03/07/2017 0930   FERRITIN 17 03/07/2017 0930   IRONPCTSAT 18 03/07/2017 0930   Lipid Panel     Component Value Date/Time   CHOL 183 03/07/2017 0930   TRIG 89 03/07/2017 0930   HDL 67 03/07/2017 0930   LDLCALC 98 03/07/2017 0930   Hepatic Function Panel     Component Value Date/Time   PROT 6.0 03/07/2017 0930   ALBUMIN 3.8 03/07/2017 0930   AST 12 03/07/2017 0930   ALT 12 03/07/2017 0930   ALKPHOS 107 03/07/2017 0930   BILITOT 0.4 03/07/2017 0930      Component Value Date/Time   TSH 3.440 03/07/2017 0930    ASSESSMENT AND PLAN: Insulin resistance  Other depression - with emotional eating  Class 2 severe obesity with serious comorbidity and body mass index (BMI) of 39.0 to 39.9 in adult, unspecified obesity type (Jenna Chang)  PLAN:  Insulin Resistance Jenna Chang will continue to work on weight loss, exercise, and decreasing simple carbohydrates in her diet to help decrease the risk of diabetes and we will recheck labs in 1 month. She was informed that eating too many simple carbohydrates or too many calories at one sitting increases the likelihood of GI side effects. Jenna Chang agreed to follow up with [place] as directed to monitor her progress.  Depression with Emotional Eating Behaviors We discussed behavior modification techniques today to help Jenna Chang deal with her emotional eating and depression. She has agreed to increase Wellbutrin SR to 200 mg qam #30 with no refills and will  follow up as directed.  Obesity Jenna Chang is currently in the action stage of change. As such, her goal is to continue with weight loss efforts She has agreed  to follow the Category 3 plan Jenna Chang has been instructed to work up to a goal of 150 minutes of combined cardio and strengthening exercise per week for weight loss and overall health benefits. We discussed the following Behavioral Modification Strategies today: increasing lean protein intake, work on meal planning and  easy cooking plans and holiday eating strategies   Amely has agreed to follow up with our clinic in 2 weeks. She was informed of the importance of frequent follow up visits to maximize her success with intensive lifestyle modifications for her multiple health conditions.  I, Doreene Nest, am acting as transcriptionist for Dennard Nip, MD  I have reviewed the above documentation for accuracy and completeness, and I agree with the above. -Dennard Nip, MD    OBESITY BEHAVIORAL INTERVENTION VISIT  Today's visit was # 3 out of 22.  Starting weight: 232 lbs Starting date: 03/07/17 Today's weight : 221 lbs Today's date: 04/18/2017 Total lbs lost to date: 11 (Patients must lose 7 lbs in the first 6 months to continue with counseling)   ASK: We discussed the diagnosis of obesity with Jenna Chang today and Jenna Chang agreed to give [place] permission to discuss obesity behavioral modification therapy today.  ASSESS: Jenna Chang has the diagnosis of obesity and her BMI today is 39.16 Jenna Chang is in the action stage of change   ADVISE: Jenna Chang was educated on the multiple health risks of obesity as well as the benefit of weight loss to improve her health. She was advised of the need for long term treatment and the importance of lifestyle modifications.  AGREE: Multiple dietary modification options and treatment options were discussed and  Jenna Chang agreed to follow the Category 3 plan We discussed the following Behavioral Modification Strategies today: increasing lean protein intake, work on meal planning and easy cooking plans and holiday eating strategies

## 2017-05-02 ENCOUNTER — Ambulatory Visit (INDEPENDENT_AMBULATORY_CARE_PROVIDER_SITE_OTHER): Payer: BC Managed Care – PPO | Admitting: Family Medicine

## 2017-05-02 VITALS — BP 143/86 | HR 75 | Temp 98.4°F | Ht 63.0 in | Wt 216.0 lb

## 2017-05-02 DIAGNOSIS — I10 Essential (primary) hypertension: Secondary | ICD-10-CM | POA: Diagnosis not present

## 2017-05-02 DIAGNOSIS — Z6838 Body mass index (BMI) 38.0-38.9, adult: Secondary | ICD-10-CM

## 2017-05-02 DIAGNOSIS — F3289 Other specified depressive episodes: Secondary | ICD-10-CM | POA: Diagnosis not present

## 2017-05-02 MED ORDER — BUPROPION HCL ER (SR) 200 MG PO TB12: 200.0000 mg | ORAL_TABLET | Freq: Every day | ORAL | 0 refills | Status: DC

## 2017-05-02 NOTE — Progress Notes (Signed)
Office: [phone]  /  Fax: [phone]   HPI:   Chief Complaint: OBESITY Jenna Chang is here to discuss her progress with her obesity treatment plan. Jenna Chang is on the Category 3 plan and is following her eating plan approximately 100 % of the time. Jenna Chang states Jenna Chang is doing strength training and walking for 30 minutes 3 times per week. Jenna Chang has done well with weight loss but greatly struggling to meet protein goal. Jenna Chang states her total protein at her primary care physician office was low but not recent of actual lab results.  Her weight is 216 lb (98 kg) today and has had a weight loss of 5 pounds over a period of 2 weeks since her last visit. Jenna Chang has lost 16 lbs since starting treatment with [place].  Hypertension Jenna Chang is a 61 y.o. female with hypertension. Jenna Chang's blood pressure is elevated today, normally well controlled. Jenna Chang denies chest pain or pain issues. Jenna Chang is working weight loss to help control her blood pressure with the goal of decreasing her risk of heart attack and stroke. Jenna Chang blood pressure is not currently controlled.  Depression with emotional eating behaviors Jenna Chang's mood is stable on Wellbutrin, Jenna Chang is doing well avoiding emotional eating. Jenna Chang is struggles with emotional eating and using food for comfort to the extent that it is negatively impacting her health. Jenna Chang often snacks when Jenna Chang is not hungry. Jenna Chang sometimes feels Jenna Chang is out of control and then feels guilty that Jenna Chang made poor food choices. Jenna Chang has been working on behavior modification techniques to help reduce her emotional eating and has been somewhat successful. Jenna Chang shows no sign of suicidal or homicidal ideations.  Depression screen PHQ 2/9 03/07/2017  Decreased Interest 3  Down, Depressed, Hopeless 0  PHQ - 2 Score 3  Altered sleeping 3  Tired, decreased energy 3  Change in appetite 2  Feeling bad or failure about yourself  1  Trouble concentrating 0  Moving slowly or fidgety/restless 2    Suicidal thoughts 0  PHQ-9 Score 14  Difficult doing work/chores Somewhat difficult  Some encounter information is confidential and restricted. Go to Review Flowsheets activity to see all data.   ALLERGIES: Allergies  Allergen Reactions  . Trovafloxacin Anaphylaxis, Itching and Swelling    IV dosing only Tolerates levaquin without problems.  . Cefdinir Nausea And Vomiting  . Amlodipine Swelling  . Amlodipine Besylate Swelling  . Hydrochlorothiazide   . Losartan Cough  . Lisinopril Cough    MEDICATIONS: Current Outpatient Medications on File Prior to Visit  Medication Sig Dispense Refill  . albuterol (PROVENTIL HFA;VENTOLIN HFA) 108 (90 Base) MCG/ACT inhaler Inhale into the lungs every 6 (six) hours as needed for wheezing or shortness of breath.    Marland Kitchen amoxicillin-clavulanate (AUGMENTIN) 875-125 MG tablet Take 1 tablet by mouth 2 (two) times daily.    . brimonidine (ALPHAGAN) 0.2 % ophthalmic solution 3 (three) times daily.    Marland Kitchen buPROPion (WELLBUTRIN SR) 200 MG 12 hr tablet Take 1 tablet (200 mg total) by mouth daily at 12 noon. 30 tablet 0  . calcium-vitamin D 250-100 MG-UNIT tablet Take 1 tablet by mouth 2 (two) times daily.    . carvedilol (COREG) 6.25 MG tablet Take 12.5 mg 2 (two) times daily with a meal by mouth.     . cetirizine (ZYRTEC) 10 MG tablet Take by mouth.    . cholecalciferol (VITAMIN D) 1000 units tablet Take by mouth 2 (two) times daily.     . cyclobenzaprine (  FLEXERIL) 5 MG tablet Take 5 mg by mouth 3 (three) times daily as needed for muscle spasms.    Marland Kitchen desmopressin (DDAVP) 0.2 MG tablet Take 0.2 mg by mouth daily.    . fluticasone (FLONASE) 50 MCG/ACT nasal spray Place 1 spray into both nostrils daily.    . furosemide (LASIX) 20 MG tablet Take 20 mg by mouth daily.    Marland Kitchen HYDROcodone-acetaminophen (NORCO) 7.5-325 MG tablet Take 1 tablet by mouth every 6 (six) hours as needed.     Marland Kitchen ipratropium (ATROVENT) 0.03 % nasal spray Place 2 sprays into both nostrils every  12 (twelve) hours.    Marland Kitchen levocetirizine (XYZAL) 5 MG tablet Take 5 mg by mouth.    . mirabegron ER (MYRBETRIQ) 50 MG TB24 tablet Take by mouth.    . Ospemifene (OSPHENA) 60 MG TABS Take 1 tablet by mouth daily.    . pantoprazole (PROTONIX) 40 MG tablet Take by mouth 2 (two) times daily.     . polycarbophil (FIBERCON) 625 MG tablet Take 1,250 mg by mouth daily.    . traZODone (DESYREL) 50 MG tablet Take 1-2 tablets nightly at hour of sleep    . valACYclovir (VALTREX) 1000 MG tablet take 1 tablet by mouth twice a day if needed    . verapamil (CALAN) 120 MG tablet Take 240 mg once by mouth.      No current facility-administered medications on file prior to visit.     PAST MEDICAL HISTORY: Past Medical History:  Diagnosis Date  . Acid reflux   . Anemia   . Anxiety   . Back pain   . Bilateral swelling of feet   . Bursitis   . Cataract   . Chest pain   . Constipation   . Cough   . DDD (degenerative disc disease), cervical   . Depression   . Ear pain   . Gallbladder disease   . GERD (gastroesophageal reflux disease)   . Hiatal hernia   . HTN (hypertension)   . IBS (irritable bowel syndrome)   . IC (interstitial cystitis)   . Low sodium levels   . Multinodular goiter   . OA (osteoarthritis)   . Osteopenia   . Scoliosis   . Sleep apnea   . Spinal stenosis   . Vitamin D deficiency     PAST SURGICAL HISTORY: Past Surgical History:  Procedure Laterality Date  . APPENDECTOMY    . CESAREAN SECTION    . CHOLECYSTECTOMY    . COLOSTOMY    . COLOSTOMY CLOSURE    . COLOSTOMY REVERSAL    . DG SCOLIOSIS SERIES ([place] HX)     rods placed  . HERNIA REPAIR    . INTERSTIM IMPLANT PLACEMENT    . NASAL SEPTUM SURGERY    . PELVIC ABCESS DRAINAGE    . TONSILECTOMY, ADENOIDECTOMY, BILATERAL MYRINGOTOMY AND TUBES    . TYMPANOSTOMY TUBE PLACEMENT    . vascular leg      SOCIAL HISTORY: Social History   Tobacco Use  . Smoking status: Not on file  Substance Use Topics  . Alcohol  use: Not on file  . Drug use: Not on file    FAMILY HISTORY: Family History  Problem Relation Age of Onset  . Hypertension Mother   . Heart disease Mother   . Stroke Mother   . Cancer Mother   . Anxiety disorder Mother   . Sleep apnea Mother   . Hyperlipidemia Father   . Hypertension Father   .  Heart disease Father   . Thyroid disease Father   . Kidney disease Father   . Cancer Father     ROS: Review of Systems  Constitutional: Positive for weight loss.  Cardiovascular: Negative for chest pain.  Psychiatric/Behavioral: Positive for depression. Negative for suicidal ideas.    PHYSICAL EXAM: Blood pressure (!) 143/86, pulse 75, temperature 98.4 F (36.9 C), temperature source Oral, height 5\' 3"  (1.6 m), weight 216 lb (98 kg), SpO2 95 %. Body mass index is 38.26 kg/m. Physical Exam  Constitutional: Jenna Chang is oriented to person, place, and time. Jenna Chang appears well-developed and well-nourished.  Cardiovascular: Normal rate.  Pulmonary/Chest: Effort normal.  Musculoskeletal: Normal range of motion.  Neurological: Jenna Chang is oriented to person, place, and time.  Skin: Skin is warm and dry.  Psychiatric: Jenna Chang has a normal mood and affect.  Vitals reviewed.   RECENT LABS AND TESTS: BMET    Component Value Date/Time   NA 133 (L) 03/07/2017 0930   K 4.9 03/07/2017 0930   CL 95 (L) 03/07/2017 0930   CO2 26 03/07/2017 0930   GLUCOSE 88 03/07/2017 0930   BUN 9 03/07/2017 0930   CREATININE 0.64 03/07/2017 0930   CALCIUM 9.2 03/07/2017 0930   GFRNONAA 97 03/07/2017 0930   GFRAA 112 03/07/2017 0930   Lab Results  Component Value Date   HGBA1C 5.2 03/07/2017   Lab Results  Component Value Date   INSULIN 7.8 03/07/2017   CBC    Component Value Date/Time   WBC 6.2 03/07/2017 0930   RBC 4.39 03/07/2017 0930   HGB 12.8 03/07/2017 0930   HCT 38.3 03/07/2017 0930   MCV 87 03/07/2017 0930   MCH 29.2 03/07/2017 0930   MCHC 33.4 03/07/2017 0930   RDW 13.8 03/07/2017 0930    LYMPHSABS 1.7 03/07/2017 0930   EOSABS 0.2 03/07/2017 0930   BASOSABS 0.0 03/07/2017 0930   Iron/TIBC/Ferritin/ %Sat    Component Value Date/Time   IRON 73 03/07/2017 0930   TIBC 401 03/07/2017 0930   FERRITIN 17 03/07/2017 0930   IRONPCTSAT 18 03/07/2017 0930   Lipid Panel     Component Value Date/Time   CHOL 183 03/07/2017 0930   TRIG 89 03/07/2017 0930   HDL 67 03/07/2017 0930   LDLCALC 98 03/07/2017 0930   Hepatic Function Panel     Component Value Date/Time   PROT 6.0 03/07/2017 0930   ALBUMIN 3.8 03/07/2017 0930   AST 12 03/07/2017 0930   ALT 12 03/07/2017 0930   ALKPHOS 107 03/07/2017 0930   BILITOT 0.4 03/07/2017 0930      Component Value Date/Time   TSH 3.440 03/07/2017 0930    ASSESSMENT AND PLAN: Essential hypertension  Other depression - with emotional eating  - Plan: buPROPion (WELLBUTRIN SR) 200 MG 12 hr tablet  Class 2 severe obesity with serious comorbidity and body mass index (BMI) of 38.0 to 38.9 in adult, unspecified obesity type (Jenna Chang)  PLAN:  Hypertension We discussed sodium restriction, working on healthy weight loss, diet, and a regular exercise program as the means to achieve improved blood pressure control. Jenna Chang agreed with this plan and agreed to follow up as directed. We will continue to monitor her blood pressure as well as her progress with the above lifestyle modifications. Jenna Chang will continue her medications as prescribed and will watch for signs of hypotension as Jenna Chang continues her lifestyle modifications. Jenna Chang agrees to follow up with our clinic in 3 weeks and we will recheck blood pressure at  that time.  Depression with Emotional Eating Behaviors We discussed behavior modification techniques today to help Jenna Chang deal with her emotional eating and depression. Jenna Chang agrees to continue taking Wellbutrin SR 200 mg qd #30 and we will refill for 1 month. Jenna Chang agrees to follow up with our clinic in 3 weeks.  Obesity Jenna Chang is currently  in the action stage of change. As such, her goal is to continue with weight loss efforts Jenna Chang has agreed to follow the Category 3 plan Jenna Chang has been instructed to work up to a goal of 150 minutes of combined cardio and strengthening exercise per week for weight loss and overall health benefits. We discussed the following Behavioral Modification Strategies today: increasing lean protein intake, dealing with family or coworker sabotage, holiday eating strategies, and travel eating strategies    Jenna Chang has agreed to follow up with our clinic in 3 weeks. Jenna Chang was informed of the importance of frequent follow up visits to maximize her success with intensive lifestyle modifications for her multiple health conditions.  I, Jenna Chang, am acting as transcriptionist for Jenna Nip, MD  I have reviewed the above documentation for accuracy and completeness, and I agree with the above. -Jenna Nip, MD     Today's visit was # 4 out of 22.  Starting weight: 232 lbs Starting date: 03/07/17 Today's weight : 216 lbs  Today's date: 05/02/2017 Total lbs lost to date: 16 (Patients must lose 7 lbs in the first 6 months to continue with counseling)   ASK: We discussed the diagnosis of obesity with Jenna Chang today and Jenna Chang agreed to give [place] permission to discuss obesity behavioral modification therapy today.  ASSESS: Camila has the diagnosis of obesity and her BMI today is 38.27 Melia is in the action stage of change   ADVISE: Audray was educated on the multiple health risks of obesity as well as the benefit of weight loss to improve her health. Jenna Chang was advised of the need for long term treatment and the importance of lifestyle modifications.  AGREE: Multiple dietary modification options and treatment options were discussed and  Jonalyn agreed to follow the Category 3 plan We discussed the following Behavioral Modification Strategies today: increasing lean protein intake, dealing with  family or coworker sabotage, holiday eating strategies, and travel eating strategies

## 2017-05-03 ENCOUNTER — Telehealth (INDEPENDENT_AMBULATORY_CARE_PROVIDER_SITE_OTHER): Payer: Self-pay | Admitting: Family Medicine

## 2017-05-03 NOTE — Telephone Encounter (Signed)
Spoke with patient and advised her that I called the pharmacy and they verified that they did receive the prescription for Buproprion, however, it is too early to fill. Advised her that [facility] said it would be able to be filled on the 26th of December.   Donal Lynam

## 2017-05-03 NOTE — Telephone Encounter (Signed)
Ms Depp states that Dr. Leafy Ro was going to send over prescriptions to [facility].  She states she was just at the pharmacy and they are telling her they haven't received anything. Thank you Maudie Mercury

## 2017-05-25 ENCOUNTER — Ambulatory Visit (INDEPENDENT_AMBULATORY_CARE_PROVIDER_SITE_OTHER): Payer: BC Managed Care – PPO | Admitting: Family Medicine

## 2017-05-25 VITALS — BP 123/81 | HR 75 | Temp 98.2°F | Ht 63.0 in | Wt 211.0 lb

## 2017-05-25 DIAGNOSIS — F3289 Other specified depressive episodes: Secondary | ICD-10-CM

## 2017-05-25 DIAGNOSIS — Z6837 Body mass index (BMI) 37.0-37.9, adult: Secondary | ICD-10-CM | POA: Diagnosis not present

## 2017-05-25 MED ORDER — BUPROPION HCL ER (SR) 150 MG PO TB12
150.0000 mg | ORAL_TABLET | Freq: Every day | ORAL | 0 refills | Status: DC
Start: 2017-05-25 — End: 2017-06-16

## 2017-05-25 NOTE — Progress Notes (Signed)
Office: [phone]  /  Fax: [phone]   HPI:   Chief Complaint: OBESITY Jenna Chang is here to discuss her progress with her obesity treatment plan. She is on the Category 3 plan and is following her eating plan approximately 98 % of the time. She states she is doing strength training for 60 minutes 2 times per week. Jenna Chang continues to do well with weight loss even over the holidays and traveling. She is struggling to eat all her protein. Her weight is 211 lb (95.7 kg) today and has had a weight loss of 5 pounds over a period of 3 weeks since her last visit. She has lost 21 lbs since starting treatment with [place].  Depression with emotional eating behaviors Jenna Chang is on Wellbutrin SR 200 mg and she is struggling with ongoing xerostoma. She now has a cavity and attributes this to her dry mouth. Jenna Chang struggles with emotional eating and using food for comfort to the extent that it is negatively impacting her health. She often snacks when she is not hungry. Jenna Chang sometimes feels she is out of control and then feels guilty that she made poor food choices. She has been working on behavior modification techniques to help reduce her emotional eating and has been somewhat successful. She shows no sign of suicidal or homicidal ideations.  Depression screen PHQ 2/9 03/07/2017  Decreased Interest 3  Down, Depressed, Hopeless 0  PHQ - 2 Score 3  Altered sleeping 3  Tired, decreased energy 3  Change in appetite 2  Feeling bad or failure about yourself  1  Trouble concentrating 0  Moving slowly or fidgety/restless 2  Suicidal thoughts 0  PHQ-9 Score 14  Difficult doing work/chores Somewhat difficult  Some encounter information is confidential and restricted. Go to Review Flowsheets activity to see all data.      ALLERGIES: Allergies  Allergen Reactions  . Trovafloxacin Anaphylaxis, Itching and Swelling    IV dosing only Tolerates levaquin without problems.  . Cefdinir Nausea And Vomiting    . Amlodipine Swelling  . Amlodipine Besylate Swelling  . Hydrochlorothiazide   . Losartan Cough  . Lisinopril Cough    MEDICATIONS: Current Outpatient Medications on File Prior to Visit  Medication Sig Dispense Refill  . albuterol (PROVENTIL HFA;VENTOLIN HFA) 108 (90 Base) MCG/ACT inhaler Inhale into the lungs every 6 (six) hours as needed for wheezing or shortness of breath.    . brimonidine (ALPHAGAN) 0.2 % ophthalmic solution 3 (three) times daily.    . calcium-vitamin D 250-100 MG-UNIT tablet Take 1 tablet by mouth 2 (two) times daily.    . carvedilol (COREG) 6.25 MG tablet Take 12.5 mg 2 (two) times daily with a meal by mouth.     . cetirizine (ZYRTEC) 10 MG tablet Take by mouth.    . cholecalciferol (VITAMIN D) 1000 units tablet Take by mouth 2 (two) times daily.     . cyclobenzaprine (FLEXERIL) 5 MG tablet Take 5 mg by mouth 3 (three) times daily as needed for muscle spasms.    Marland Kitchen desmopressin (DDAVP) 0.2 MG tablet Take 0.2 mg by mouth daily.    . fluticasone (FLONASE) 50 MCG/ACT nasal spray Place 1 spray into both nostrils daily.    . furosemide (LASIX) 20 MG tablet Take 20 mg by mouth daily.    Marland Kitchen HYDROcodone-acetaminophen (NORCO) 7.5-325 MG tablet Take 1 tablet by mouth every 6 (six) hours as needed.     Marland Kitchen ipratropium (ATROVENT) 0.03 % nasal spray Place 2 sprays into  both nostrils every 12 (twelve) hours.    Marland Kitchen levocetirizine (XYZAL) 5 MG tablet Take 5 mg by mouth.    . mirabegron ER (MYRBETRIQ) 50 MG TB24 tablet Take by mouth.    . Ospemifene (OSPHENA) 60 MG TABS Take 1 tablet by mouth daily.    . pantoprazole (PROTONIX) 40 MG tablet Take by mouth 2 (two) times daily.     . polycarbophil (FIBERCON) 625 MG tablet Take 1,250 mg by mouth daily.    . traZODone (DESYREL) 50 MG tablet Take 1-2 tablets nightly at hour of sleep    . valACYclovir (VALTREX) 1000 MG tablet take 1 tablet by mouth twice a day if needed    . verapamil (CALAN) 120 MG tablet Take 240 mg once by mouth.       No current facility-administered medications on file prior to visit.     PAST MEDICAL HISTORY: Past Medical History:  Diagnosis Date  . Acid reflux   . Anemia   . Anxiety   . Back pain   . Bilateral swelling of feet   . Bursitis   . Cataract   . Chest pain   . Constipation   . Cough   . DDD (degenerative disc disease), cervical   . Depression   . Ear pain   . Gallbladder disease   . GERD (gastroesophageal reflux disease)   . Hiatal hernia   . HTN (hypertension)   . IBS (irritable bowel syndrome)   . IC (interstitial cystitis)   . Low sodium levels   . Multinodular goiter   . OA (osteoarthritis)   . Osteopenia   . Scoliosis   . Sleep apnea   . Spinal stenosis   . Vitamin D deficiency     PAST SURGICAL HISTORY: Past Surgical History:  Procedure Laterality Date  . APPENDECTOMY    . CESAREAN SECTION    . CHOLECYSTECTOMY    . COLOSTOMY    . COLOSTOMY CLOSURE    . COLOSTOMY REVERSAL    . DG SCOLIOSIS SERIES ([place] HX)     rods placed  . HERNIA REPAIR    . INTERSTIM IMPLANT PLACEMENT    . NASAL SEPTUM SURGERY    . PELVIC ABCESS DRAINAGE    . TONSILECTOMY, ADENOIDECTOMY, BILATERAL MYRINGOTOMY AND TUBES    . TYMPANOSTOMY TUBE PLACEMENT    . vascular leg      SOCIAL HISTORY: Social History   Tobacco Use  . Smoking status: Not on file  Substance Use Topics  . Alcohol use: Not on file  . Drug use: Not on file    FAMILY HISTORY: Family History  Problem Relation Age of Onset  . Hypertension Mother   . Heart disease Mother   . Stroke Mother   . Cancer Mother   . Anxiety disorder Mother   . Sleep apnea Mother   . Hyperlipidemia Father   . Hypertension Father   . Heart disease Father   . Thyroid disease Father   . Kidney disease Father   . Cancer Father     ROS: Review of Systems  Constitutional: Positive for weight loss.  HENT:       Positive dry mouth  Psychiatric/Behavioral: Positive for depression. Negative for suicidal ideas.     PHYSICAL EXAM: Blood pressure 123/81, pulse 75, temperature 98.2 F (36.8 C), temperature source Oral, height 5\' 3"  (1.6 m), weight 211 lb (95.7 kg), SpO2 100 %. Body mass index is 37.38 kg/m. Physical Exam  Constitutional: She is oriented  to person, place, and time. She appears well-developed and well-nourished.  Cardiovascular: Normal rate.  Pulmonary/Chest: Effort normal.  Musculoskeletal: Normal range of motion.  Neurological: She is oriented to person, place, and time.  Skin: Skin is warm and dry.  Psychiatric: She has a normal mood and affect. Her behavior is normal.  Vitals reviewed.   RECENT LABS AND TESTS: BMET    Component Value Date/Time   NA 133 (L) 03/07/2017 0930   K 4.9 03/07/2017 0930   CL 95 (L) 03/07/2017 0930   CO2 26 03/07/2017 0930   GLUCOSE 88 03/07/2017 0930   BUN 9 03/07/2017 0930   CREATININE 0.64 03/07/2017 0930   CALCIUM 9.2 03/07/2017 0930   GFRNONAA 97 03/07/2017 0930   GFRAA 112 03/07/2017 0930   Lab Results  Component Value Date   HGBA1C 5.2 03/07/2017   Lab Results  Component Value Date   INSULIN 7.8 03/07/2017   CBC    Component Value Date/Time   WBC 6.2 03/07/2017 0930   RBC 4.39 03/07/2017 0930   HGB 12.8 03/07/2017 0930   HCT 38.3 03/07/2017 0930   MCV 87 03/07/2017 0930   MCH 29.2 03/07/2017 0930   MCHC 33.4 03/07/2017 0930   RDW 13.8 03/07/2017 0930   LYMPHSABS 1.7 03/07/2017 0930   EOSABS 0.2 03/07/2017 0930   BASOSABS 0.0 03/07/2017 0930   Iron/TIBC/Ferritin/ %Sat    Component Value Date/Time   IRON 73 03/07/2017 0930   TIBC 401 03/07/2017 0930   FERRITIN 17 03/07/2017 0930   IRONPCTSAT 18 03/07/2017 0930   Lipid Panel     Component Value Date/Time   CHOL 183 03/07/2017 0930   TRIG 89 03/07/2017 0930   HDL 67 03/07/2017 0930   LDLCALC 98 03/07/2017 0930   Hepatic Function Panel     Component Value Date/Time   PROT 6.0 03/07/2017 0930   ALBUMIN 3.8 03/07/2017 0930   AST 12 03/07/2017 0930   ALT 12  03/07/2017 0930   ALKPHOS 107 03/07/2017 0930   BILITOT 0.4 03/07/2017 0930      Component Value Date/Time   TSH 3.440 03/07/2017 0930    ASSESSMENT AND PLAN: Other depression - with emotional eating - Plan: buPROPion (WELLBUTRIN SR) 150 MG 12 hr tablet  Class 2 severe obesity with serious comorbidity and body mass index (BMI) of 37.0 to 37.9 in adult, unspecified obesity type ([place])  PLAN:  Depression with Emotional Eating Behaviors We discussed behavior modification techniques today to help Hayzlee deal with her emotional eating and depression. She has agreed to decrease Wellbutrin SR to150 mg qam #30 with no refills and follow up with our clinic in 3 weeks.  Obesity Jessel is currently in the action stage of change. As such, her goal is to continue with weight loss efforts She has agreed to keep a food journal with 400 to 550 calories and 35+ grams of protein daily and follow the Category 3 plan Kanyon has been instructed to work up to a goal of 150 minutes of combined cardio and strengthening exercise per week for weight loss and overall health benefits. We discussed the following Behavioral Modification Strategies today: increasing lean protein intake and no skipping meals  Kerryann has agreed to follow up with our clinic in 3 weeks. She was informed of the importance of frequent follow up visits to maximize her success with intensive lifestyle modifications for her multiple health conditions.   OBESITY BEHAVIORAL INTERVENTION VISIT  Today's visit was # 5 out of 22.  Starting weight: 232 lbs Starting date: 03/07/17 Today's weight : 211 lbs Today's date: 05/25/2017 Total lbs lost to date: 21 (Patients must lose 7 lbs in the first 6 months to continue with counseling)   ASK: We discussed the diagnosis of obesity with Jenna Chang today and Jenna Chang agreed to give [place] permission to discuss obesity behavioral modification therapy today.  ASSESS: Jenna Chang has the diagnosis of  obesity and her BMI today is 37.39 Jenna Chang is in the action stage of change   ADVISE: Jenna Chang was educated on the multiple health risks of obesity as well as the benefit of weight loss to improve her health. She was advised of the need for long term treatment and the importance of lifestyle modifications.  AGREE: Multiple dietary modification options and treatment options were discussed and  Jenna Chang agreed to the above obesity treatment plan.  I, Doreene Nest, am acting as transcriptionist for Dennard Nip, MD  I have reviewed the above documentation for accuracy and completeness, and I agree with the above. -Dennard Nip, MD

## 2017-06-16 ENCOUNTER — Ambulatory Visit (INDEPENDENT_AMBULATORY_CARE_PROVIDER_SITE_OTHER): Payer: BC Managed Care – PPO | Admitting: Physician Assistant

## 2017-06-16 VITALS — BP 101/68 | HR 70 | Temp 98.5°F | Ht 63.0 in | Wt 206.0 lb

## 2017-06-16 DIAGNOSIS — I10 Essential (primary) hypertension: Secondary | ICD-10-CM | POA: Diagnosis not present

## 2017-06-16 DIAGNOSIS — R5383 Other fatigue: Secondary | ICD-10-CM

## 2017-06-16 DIAGNOSIS — E8881 Metabolic syndrome: Secondary | ICD-10-CM

## 2017-06-16 DIAGNOSIS — Z6836 Body mass index (BMI) 36.0-36.9, adult: Secondary | ICD-10-CM

## 2017-06-16 DIAGNOSIS — Z9189 Other specified personal risk factors, not elsewhere classified: Secondary | ICD-10-CM | POA: Diagnosis not present

## 2017-06-16 DIAGNOSIS — F3289 Other specified depressive episodes: Secondary | ICD-10-CM

## 2017-06-16 MED ORDER — BUPROPION HCL ER (SR) 150 MG PO TB12: 150.0000 mg | ORAL_TABLET | Freq: Every day | ORAL | 0 refills | Status: DC

## 2017-06-16 NOTE — Progress Notes (Signed)
Office: [phone]  /  Fax: [phone]   HPI:   Chief Complaint: OBESITY Jenna Chang is here to discuss her progress with her obesity treatment plan. She is on the Category 3 plan and is following her eating plan approximately 100 % of the time. She states she is walking for 30 minutes 3 times per week. Jenna Chang continues to do well with weight loss. She would like more recipes to incorporate into her dinner meals. Her weight is 206 lb (93.4 kg) today and has had a weight loss of 5 pounds over a period of 3 weeks since her last visit. She has lost 26 lbs since starting treatment with [place].  Hypertension Jenna Chang is a 62 y.o. female with hypertension. She is currently taking verapamil and carvedilol Jenna Chang admits to fatigue and denies chest pain or shortness of breath on exertion. She is working weight loss to help control her blood pressure with the goal of decreasing her risk of heart attack and stroke. Jenna Chang blood pressure is stable.  At risk for cardiovascular disease Jenna Chang is at a higher than average risk for cardiovascular disease due to obesity and hypertension. She currently denies any chest pain.  Fatigue Jenna Chang has noticed more fatigue recently. She has a history of obstructive sleep apnea and has CPAP that she states she uses nightly. Jenna Chang was instructed to keep a blood pressure log at home and bring in for review.  Insulin Resistance Jenna Chang has a diagnosis of insulin resistance based on her elevated fasting insulin level >5. Although Jenna Chang's blood glucose readings are still under good control, insulin resistance puts her at greater risk of metabolic syndrome and diabetes. She is not taking metformin currently and continues to work on diet and exercise to decrease risk of diabetes. Jenna Chang denies polyphagia.  Depression with emotional eating behaviors Jenna Chang is struggling with emotional eating and using food for comfort to the extent that it is negatively impacting  her health. She often snacks when she is not hungry. Jenna Chang sometimes feels she is out of control and then feels guilty that she made poor food choices. She has been working on behavior modification techniques to help reduce her emotional eating and has been somewhat successful. She admits to insomnia on occasion and was advised to take her wellbutrin early in the morning to avoid insomnia. Her mood is stable and she shows no sign of suicidal or homicidal ideations.  Depression screen PHQ 2/9 03/07/2017  Decreased Interest 3  Down, Depressed, Hopeless 0  PHQ - 2 Score 3  Altered sleeping 3  Tired, decreased energy 3  Change in appetite 2  Feeling bad or failure about yourself  1  Trouble concentrating 0  Moving slowly or fidgety/restless 2  Suicidal thoughts 0  PHQ-9 Score 14  Difficult doing work/chores Somewhat difficult  Some encounter information is confidential and restricted. Go to Review Flowsheets activity to see all data.     ALLERGIES: Allergies  Allergen Reactions  . Trovafloxacin Anaphylaxis, Itching and Swelling    IV dosing only Tolerates levaquin without problems.  . Cefdinir Nausea And Vomiting  . Amlodipine Swelling  . Amlodipine Besylate Swelling  . Hydrochlorothiazide   . Losartan Cough  . Lisinopril Cough    MEDICATIONS: Current Outpatient Medications on File Prior to Visit  Medication Sig Dispense Refill  . albuterol (PROVENTIL HFA;VENTOLIN HFA) 108 (90 Base) MCG/ACT inhaler Inhale into the lungs every 6 (six) hours as needed for wheezing or shortness of breath.    Marland Kitchen  brimonidine (ALPHAGAN) 0.2 % ophthalmic solution 3 (three) times daily.    Marland Kitchen buPROPion (WELLBUTRIN SR) 150 MG 12 hr tablet Take 1 tablet (150 mg total) by mouth daily. 30 tablet 0  . calcium-vitamin D 250-100 MG-UNIT tablet Take 1 tablet by mouth 2 (two) times daily.    . carvedilol (COREG) 6.25 MG tablet Take 12.5 mg 2 (two) times daily with a meal by mouth.     . cholecalciferol (VITAMIN D)  1000 units tablet Take by mouth 2 (two) times daily.     . cyclobenzaprine (FLEXERIL) 5 MG tablet Take 5 mg by mouth 3 (three) times daily as needed for muscle spasms.    Marland Kitchen desmopressin (DDAVP) 0.2 MG tablet Take 0.2 mg by mouth daily.    . furosemide (LASIX) 20 MG tablet Take 20 mg by mouth daily.    Marland Kitchen HYDROcodone-acetaminophen (NORCO) 7.5-325 MG tablet Take 1 tablet by mouth every 6 (six) hours as needed.     Marland Kitchen levocetirizine (XYZAL) 5 MG tablet Take 5 mg by mouth.    . magnesium 30 MG tablet Take 30 mg by mouth 2 (two) times daily.    . mirabegron ER (MYRBETRIQ) 50 MG TB24 tablet Take by mouth.    . Ospemifene (OSPHENA) 60 MG TABS Take 1 tablet by mouth daily.    . pantoprazole (PROTONIX) 40 MG tablet Take by mouth 2 (two) times daily.     . polycarbophil (FIBERCON) 625 MG tablet Take 1,250 mg by mouth daily.    . traZODone (DESYREL) 50 MG tablet Take 1-2 tablets nightly at hour of sleep    . valACYclovir (VALTREX) 1000 MG tablet take 1 tablet by mouth twice a day if needed    . verapamil (CALAN) 120 MG tablet Take 240 mg once by mouth.      No current facility-administered medications on file prior to visit.     PAST MEDICAL HISTORY: Past Medical History:  Diagnosis Date  . Acid reflux   . Anemia   . Anxiety   . Back pain   . Bilateral swelling of feet   . Bursitis   . Cataract   . Chest pain   . Constipation   . Cough   . DDD (degenerative disc disease), cervical   . Depression   . Ear pain   . Gallbladder disease   . GERD (gastroesophageal reflux disease)   . Hiatal hernia   . HTN (hypertension)   . IBS (irritable bowel syndrome)   . IC (interstitial cystitis)   . Low sodium levels   . Multinodular goiter   . OA (osteoarthritis)   . Osteopenia   . Scoliosis   . Sleep apnea   . Spinal stenosis   . Vitamin D deficiency     PAST SURGICAL HISTORY: Past Surgical History:  Procedure Laterality Date  . APPENDECTOMY    . CESAREAN SECTION    . CHOLECYSTECTOMY    .  COLOSTOMY    . COLOSTOMY CLOSURE    . COLOSTOMY REVERSAL    . DG SCOLIOSIS SERIES ([place] HX)     rods placed  . HERNIA REPAIR    . INTERSTIM IMPLANT PLACEMENT    . NASAL SEPTUM SURGERY    . PELVIC ABCESS DRAINAGE    . TONSILECTOMY, ADENOIDECTOMY, BILATERAL MYRINGOTOMY AND TUBES    . TYMPANOSTOMY TUBE PLACEMENT    . vascular leg      SOCIAL HISTORY: Social History   Tobacco Use  . Smoking status: Not on  file  Substance Use Topics  . Alcohol use: Not on file  . Drug use: Not on file    FAMILY HISTORY: Family History  Problem Relation Age of Onset  . Hypertension Mother   . Heart disease Mother   . Stroke Mother   . Cancer Mother   . Anxiety disorder Mother   . Sleep apnea Mother   . Hyperlipidemia Father   . Hypertension Father   . Heart disease Father   . Thyroid disease Father   . Kidney disease Father   . Cancer Father     ROS: Review of Systems  Constitutional: Positive for malaise/fatigue and weight loss.  Respiratory: Negative for shortness of breath (on exertion).   Cardiovascular: Negative for chest pain.  Endo/Heme/Allergies:       Negative polyphagia  Psychiatric/Behavioral: Positive for depression. Negative for suicidal ideas. The patient has insomnia.     PHYSICAL EXAM: Blood pressure 101/68, pulse 70, temperature 98.5 F (36.9 C), temperature source Oral, height 5\' 3"  (1.6 m), weight 206 lb (93.4 kg), SpO2 98 %. Body mass index is 36.49 kg/m. Physical Exam  Constitutional: She is oriented to person, place, and time. She appears well-developed and well-nourished.  Cardiovascular: Normal rate.  Pulmonary/Chest: Effort normal.  Musculoskeletal: Normal range of motion.  Neurological: She is oriented to person, place, and time.  Skin: Skin is warm and dry.  Psychiatric: She has a normal mood and affect. Her behavior is normal.  Vitals reviewed.   RECENT LABS AND TESTS: BMET    Component Value Date/Time   NA 133 (L) 03/07/2017 0930   K 4.9  03/07/2017 0930   CL 95 (L) 03/07/2017 0930   CO2 26 03/07/2017 0930   GLUCOSE 88 03/07/2017 0930   BUN 9 03/07/2017 0930   CREATININE 0.64 03/07/2017 0930   CALCIUM 9.2 03/07/2017 0930   GFRNONAA 97 03/07/2017 0930   GFRAA 112 03/07/2017 0930   Lab Results  Component Value Date   HGBA1C 5.2 03/07/2017   Lab Results  Component Value Date   INSULIN 7.8 03/07/2017   CBC    Component Value Date/Time   WBC 6.2 03/07/2017 0930   RBC 4.39 03/07/2017 0930   HGB 12.8 03/07/2017 0930   HCT 38.3 03/07/2017 0930   MCV 87 03/07/2017 0930   MCH 29.2 03/07/2017 0930   MCHC 33.4 03/07/2017 0930   RDW 13.8 03/07/2017 0930   LYMPHSABS 1.7 03/07/2017 0930   EOSABS 0.2 03/07/2017 0930   BASOSABS 0.0 03/07/2017 0930   Iron/TIBC/Ferritin/ %Sat    Component Value Date/Time   IRON 73 03/07/2017 0930   TIBC 401 03/07/2017 0930   FERRITIN 17 03/07/2017 0930   IRONPCTSAT 18 03/07/2017 0930   Lipid Panel     Component Value Date/Time   CHOL 183 03/07/2017 0930   TRIG 89 03/07/2017 0930   HDL 67 03/07/2017 0930   LDLCALC 98 03/07/2017 0930   Hepatic Function Panel     Component Value Date/Time   PROT 6.0 03/07/2017 0930   ALBUMIN 3.8 03/07/2017 0930   AST 12 03/07/2017 0930   ALT 12 03/07/2017 0930   ALKPHOS 107 03/07/2017 0930   BILITOT 0.4 03/07/2017 0930      Component Value Date/Time   TSH 3.440 03/07/2017 0930    ASSESSMENT AND PLAN: Essential hypertension  Other fatigue - Plan: CBC With Differential, T3, T4, free, TSH  Insulin resistance - Plan: Comprehensive metabolic panel, Hemoglobin A1c, Insulin, random  Other depression - with emotional  eating - Plan: buPROPion (WELLBUTRIN SR) 150 MG 12 hr tablet  At risk for heart disease  Class 2 severe obesity with serious comorbidity and body mass index (BMI) of 36.0 to 36.9 in adult, unspecified obesity type (Jenna Chang)  PLAN:  Hypertension We discussed sodium restriction, working on healthy weight loss, and a regular  exercise program as the means to achieve improved blood pressure control. Jenna Chang was advised to keep a blood pressure log at home and bring in for review to determine if any adjustment in the medicine is needed. Jenna Chang agreed with this plan and agreed to follow up as directed. We will continue to monitor her blood pressure as well as her progress with the above lifestyle modifications. She will continue her medications as prescribed and will watch for signs of hypotension as she continues her lifestyle modifications.   Cardiovascular risk counseling Stellarose was given extended (15 minutes) coronary artery disease prevention counseling today. She is 62 y.o. female and has risk factors for heart disease including obesity and hypertension. We discussed intensive lifestyle modifications today with an emphasis on specific weight loss instructions and strategies. Pt was also informed of the importance of increasing exercise and decreasing saturated fats to help prevent heart disease.  Fatigue Jenna Chang was informed that her fatigue may be related to obesity, depression or many other causes. We will check CBC and thyroid panel and in the meanwhile Jenna Chang has agreed to work on diet, exercise and weight loss to help with fatigue.   Insulin Resistance Jenna Chang will continue to work on weight loss, exercise, and decreasing simple carbohydrates in her diet to help decrease the risk of diabetes. She was informed that eating too many simple carbohydrates or too many calories at one sitting increases the likelihood of GI side effects. We will check labs and Jenna Chang agreed to follow up with [place] as directed to monitor her progress.  Depression with Emotional Eating Behaviors We discussed behavior modification techniques today to help Jenna Chang deal with her emotional eating and depression. She has agreed to continue Wellbutrin SR 150 mg qd #30 with no refills and follow up as directed.  Obesity Jenna Chang is currently in the action  stage of change. As such, her goal is to continue with weight loss efforts She has agreed to keep a food journal with 400 to 500 calories and 35 grams of protein at supper daily and follow the Category 3 plan Jenna Chang has been instructed to work up to a goal of 150 minutes of combined cardio and strengthening exercise per week for weight loss and overall health benefits. We discussed the following Behavioral Modification Strategies today: increasing lean protein intake and work on meal planning and easy cooking plans  Anjana has agreed to follow up with our clinic in 3 weeks. She was informed of the importance of frequent follow up visits to maximize her success with intensive lifestyle modifications for her multiple health conditions.   OBESITY BEHAVIORAL INTERVENTION VISIT  Today's visit was # 6 out of 22.  Starting weight: 232 lbs Starting date: 03/07/17 Today's weight : 206 lbs Today's date: 06/16/2017 Total lbs lost to date: 8 (Patients must lose 7 lbs in the first 6 months to continue with counseling)   ASK: We discussed the diagnosis of obesity with Jenna Chang today and Mikhaela agreed to give [place] permission to discuss obesity behavioral modification therapy today.  ASSESS: Zetta has the diagnosis of obesity and her BMI today is 36.5 Samera is in the action stage of  change   ADVISE: Rethel was educated on the multiple health risks of obesity as well as the benefit of weight loss to improve her health. She was advised of the need for long term treatment and the importance of lifestyle modifications.  AGREE: Multiple dietary modification options and treatment options were discussed and  Layni agreed to the above obesity treatment plan.   Corey Skains, am acting as transcriptionist for [employer], PA-C I, Lacy Duverney [facility], have reviewed this note and agree with its content.

## 2017-06-17 LAB — COMPREHENSIVE METABOLIC PANEL
A/G RATIO: 1.4 (ref 1.2–2.2)
ALT: 9 IU/L (ref 0–32)
AST: 11 IU/L (ref 0–40)
Albumin: 3.8 g/dL (ref 3.6–4.8)
Alkaline Phosphatase: 103 IU/L (ref 39–117)
BUN/Creatinine Ratio: 30 — ABNORMAL HIGH (ref 12–28)
BUN: 25 mg/dL (ref 8–27)
CHLORIDE: 100 mmol/L (ref 96–106)
CO2: 24 mmol/L (ref 20–29)
Calcium: 9.1 mg/dL (ref 8.7–10.3)
Creatinine, Ser: 0.82 mg/dL (ref 0.57–1.00)
GFR calc Af Amer: 90 mL/min/{1.73_m2} (ref >59)
GFR, EST NON AFRICAN AMERICAN: 78 mL/min/{1.73_m2} (ref >59)
GLOBULIN, TOTAL: 2.7 g/dL (ref 1.5–4.5)
Glucose: 89 mg/dL (ref 65–99)
POTASSIUM: 4.1 mmol/L (ref 3.5–5.2)
SODIUM: 138 mmol/L (ref 134–144)
Total Protein: 6.5 g/dL (ref 6.0–8.5)

## 2017-06-17 LAB — CBC WITH DIFFERENTIAL
BASOS ABS: 0 10*3/uL (ref 0.0–0.2)
Basos: 0 %
EOS (ABSOLUTE): 0.3 10*3/uL (ref 0.0–0.4)
Eos: 3 %
HEMOGLOBIN: 12.3 g/dL (ref 11.1–15.9)
Hematocrit: 38.7 % (ref 34.0–46.6)
IMMATURE GRANS (ABS): 0 10*3/uL (ref 0.0–0.1)
IMMATURE GRANULOCYTES: 0 %
LYMPHS: 28 %
Lymphocytes Absolute: 2.6 10*3/uL (ref 0.7–3.1)
MCH: 26.9 pg (ref 26.6–33.0)
MCHC: 31.8 g/dL (ref 31.5–35.7)
MCV: 85 fL (ref 79–97)
MONOCYTES: 7 %
Monocytes Absolute: 0.7 10*3/uL (ref 0.1–0.9)
NEUTROS ABS: 5.9 10*3/uL (ref 1.4–7.0)
NEUTROS PCT: 62 %
RBC: 4.58 x10E6/uL (ref 3.77–5.28)
RDW: 15.1 % (ref 12.3–15.4)
WBC: 9.5 10*3/uL (ref 3.4–10.8)

## 2017-06-17 LAB — HEMOGLOBIN A1C
ESTIMATED AVERAGE GLUCOSE: 114 mg/dL
HEMOGLOBIN A1C: 5.6 % (ref 4.8–5.6)

## 2017-06-17 LAB — T4, FREE: Free T4: 1.11 ng/dL (ref 0.82–1.77)

## 2017-06-17 LAB — T3: T3, Total: 130 ng/dL (ref 71–180)

## 2017-06-17 LAB — INSULIN, RANDOM: INSULIN: 7.6 u[IU]/mL (ref 2.6–24.9)

## 2017-06-17 LAB — TSH: TSH: 4.1 u[IU]/mL (ref 0.450–4.500)

## 2017-07-07 ENCOUNTER — Ambulatory Visit (INDEPENDENT_AMBULATORY_CARE_PROVIDER_SITE_OTHER): Payer: BC Managed Care – PPO | Admitting: Family Medicine

## 2017-07-07 VITALS — BP 102/69 | HR 82 | Temp 97.9°F | Ht 63.0 in | Wt 205.0 lb

## 2017-07-07 DIAGNOSIS — F3289 Other specified depressive episodes: Secondary | ICD-10-CM | POA: Diagnosis not present

## 2017-07-07 DIAGNOSIS — Z6836 Body mass index (BMI) 36.0-36.9, adult: Secondary | ICD-10-CM

## 2017-07-07 MED ORDER — BUPROPION HCL ER (SR) 150 MG PO TB12: 150.0000 mg | ORAL_TABLET | Freq: Every day | ORAL | 0 refills | Status: DC

## 2017-07-07 NOTE — Progress Notes (Signed)
Office: [phone]  /  Fax: [phone]   HPI:   Chief Complaint: OBESITY Jenna Chang is here to discuss her progress with her obesity treatment plan. She is on the journal 400-500 calories and 35 grams of protein at supper follow the Category 3 plan and is following her eating plan approximately 98 % of the time. She states she is walking for 10-15 minutes 2-3 times per week. Jenna Chang continues to do well with weight loss. Her hunger is controlled and craving have improved. She has steroid injections this last month, but has still done well following her plan.   Her weight is 205 lb (93 kg) today and has had a weight loss of 1 pound over a period of 3 weeks since her last visit. She has lost 27 lbs since starting treatment with [place].  Depression with emotional eating behaviors Jenna Chang is struggling with emotional eating and using food for comfort to the extent that it is negatively impacting her health. She often snacks when she is not hungry. Jenna Chang sometimes feels she is out of control and then feels guilty that she made poor food choices. She has been working on behavior modification techniques to help reduce her emotional eating and has been somewhat successful She is doing well on Wellbutrin, her blood pressure is stable. She denies worsening insomnia. She shows no sign of suicidal or homicidal ideations.  Depression screen PHQ 2/9 03/07/2017  Decreased Interest 3  Down, Depressed, Hopeless 0  PHQ - 2 Score 3  Altered sleeping 3  Tired, decreased energy 3  Change in appetite 2  Feeling bad or failure about yourself  1  Trouble concentrating 0  Moving slowly or fidgety/restless 2  Suicidal thoughts 0  PHQ-9 Score 14  Difficult doing work/chores Somewhat difficult  Some encounter information is confidential and restricted. Go to Review Flowsheets activity to see all data.       ALLERGIES: Allergies  Allergen Reactions  . Trovafloxacin Anaphylaxis, Itching and Swelling    IV  dosing only Tolerates levaquin without problems.  . Cefdinir Nausea And Vomiting  . Amlodipine Swelling  . Amlodipine Besylate Swelling  . Hydrochlorothiazide   . Losartan Cough  . Lisinopril Cough    MEDICATIONS: Current Outpatient Medications on File Prior to Visit  Medication Sig Dispense Refill  . albuterol (PROVENTIL HFA;VENTOLIN HFA) 108 (90 Base) MCG/ACT inhaler Inhale into the lungs every 6 (six) hours as needed for wheezing or shortness of breath.    . brimonidine (ALPHAGAN) 0.2 % ophthalmic solution 3 (three) times daily.    Marland Kitchen buPROPion (WELLBUTRIN SR) 150 MG 12 hr tablet Take 1 tablet (150 mg total) by mouth daily. 30 tablet 0  . calcium-vitamin D 250-100 MG-UNIT tablet Take 1 tablet by mouth 2 (two) times daily.    . carvedilol (COREG) 6.25 MG tablet Take 12.5 mg 2 (two) times daily with a meal by mouth.     . cholecalciferol (VITAMIN D) 1000 units tablet Take by mouth 2 (two) times daily.     . cyclobenzaprine (FLEXERIL) 5 MG tablet Take 5 mg by mouth 3 (three) times daily as needed for muscle spasms.    Marland Kitchen desmopressin (DDAVP) 0.2 MG tablet Take 0.2 mg by mouth daily.    . furosemide (LASIX) 20 MG tablet Take 20 mg by mouth daily.    Marland Kitchen HYDROcodone-acetaminophen (NORCO) 7.5-325 MG tablet Take 1 tablet by mouth every 6 (six) hours as needed.     Marland Kitchen levocetirizine (XYZAL) 5 MG tablet Take  5 mg by mouth.    . magnesium 30 MG tablet Take 30 mg by mouth 2 (two) times daily.    . mirabegron ER (MYRBETRIQ) 50 MG TB24 tablet Take by mouth.    . Ospemifene (OSPHENA) 60 MG TABS Take 1 tablet by mouth daily.    . pantoprazole (PROTONIX) 40 MG tablet Take by mouth 2 (two) times daily.     . polycarbophil (FIBERCON) 625 MG tablet Take 1,250 mg by mouth daily.    . traZODone (DESYREL) 50 MG tablet Take 1-2 tablets nightly at hour of sleep    . valACYclovir (VALTREX) 1000 MG tablet take 1 tablet by mouth twice a day if needed    . verapamil (CALAN) 120 MG tablet Take 240 mg once by mouth.       No current facility-administered medications on file prior to visit.     PAST MEDICAL HISTORY: Past Medical History:  Diagnosis Date  . Acid reflux   . Anemia   . Anxiety   . Back pain   . Bilateral swelling of feet   . Bursitis   . Cataract   . Chest pain   . Constipation   . Cough   . DDD (degenerative disc disease), cervical   . Depression   . Ear pain   . Gallbladder disease   . GERD (gastroesophageal reflux disease)   . Hiatal hernia   . HTN (hypertension)   . IBS (irritable bowel syndrome)   . IC (interstitial cystitis)   . Low sodium levels   . Multinodular goiter   . OA (osteoarthritis)   . Osteopenia   . Scoliosis   . Sleep apnea   . Spinal stenosis   . Vitamin D deficiency     PAST SURGICAL HISTORY: Past Surgical History:  Procedure Laterality Date  . APPENDECTOMY    . CESAREAN SECTION    . CHOLECYSTECTOMY    . COLOSTOMY    . COLOSTOMY CLOSURE    . COLOSTOMY REVERSAL    . DG SCOLIOSIS SERIES ([place] HX)     rods placed  . HERNIA REPAIR    . INTERSTIM IMPLANT PLACEMENT    . NASAL SEPTUM SURGERY    . PELVIC ABCESS DRAINAGE    . TONSILECTOMY, ADENOIDECTOMY, BILATERAL MYRINGOTOMY AND TUBES    . TYMPANOSTOMY TUBE PLACEMENT    . vascular leg      SOCIAL HISTORY: Social History   Tobacco Use  . Smoking status: Not on file  Substance Use Topics  . Alcohol use: Not on file  . Drug use: Not on file    FAMILY HISTORY: Family History  Problem Relation Age of Onset  . Hypertension Mother   . Heart disease Mother   . Stroke Mother   . Cancer Mother   . Anxiety disorder Mother   . Sleep apnea Mother   . Hyperlipidemia Father   . Hypertension Father   . Heart disease Father   . Thyroid disease Father   . Kidney disease Father   . Cancer Father     ROS: Review of Systems  Constitutional: Positive for weight loss.  Psychiatric/Behavioral: Positive for depression. Negative for suicidal ideas. The patient does not have insomnia.         Negative for homicidal ideations     PHYSICAL EXAM: Blood pressure 102/69, pulse 82, temperature 97.9 F (36.6 C), temperature source Oral, height 5\' 3"  (1.6 m), weight 205 lb (93 kg), SpO2 96 %. Body mass index is 36.Jenna Chang  kg/m. Physical Exam  Constitutional: She is oriented to person, place, and time. She appears well-developed and well-nourished.  HENT:  Head: Normocephalic.  Eyes: EOM are normal.  Neck: Normal range of motion.  Cardiovascular: Normal rate.  Pulmonary/Chest: Effort normal.  Musculoskeletal: Normal range of motion.  Neurological: She is alert and oriented to person, place, and time.  Skin: Skin is warm and dry.  Psychiatric: She has a normal mood and affect. Her behavior is normal.  Vitals reviewed.   RECENT LABS AND TESTS: BMET    Component Value Date/Time   NA 138 06/16/2017 0945   K 4.1 06/16/2017 0945   CL 100 06/16/2017 0945   CO2 24 06/16/2017 0945   GLUCOSE 89 06/16/2017 0945   BUN 25 06/16/2017 0945   CREATININE 0.82 06/16/2017 0945   CALCIUM 9.1 06/16/2017 0945   GFRNONAA 78 06/16/2017 0945   GFRAA 90 06/16/2017 0945   Lab Results  Component Value Date   HGBA1C 5.6 06/16/2017   HGBA1C 5.2 03/07/2017   Lab Results  Component Value Date   INSULIN 7.6 06/16/2017   INSULIN 7.8 03/07/2017   CBC    Component Value Date/Time   WBC 9.5 06/16/2017 0945   RBC 4.58 06/16/2017 0945   HGB 12.3 06/16/2017 0945   HCT 38.7 06/16/2017 0945   MCV 85 06/16/2017 0945   MCH 26.9 06/16/2017 0945   MCHC 31.8 06/16/2017 0945   RDW 15.1 06/16/2017 0945   LYMPHSABS 2.6 06/16/2017 0945   EOSABS 0.3 06/16/2017 0945   BASOSABS 0.0 06/16/2017 0945   Iron/TIBC/Ferritin/ %Sat    Component Value Date/Time   IRON 73 03/07/2017 0930   TIBC 401 03/07/2017 0930   FERRITIN 17 03/07/2017 0930   IRONPCTSAT 18 03/07/2017 0930   Lipid Panel     Component Value Date/Time   CHOL 183 03/07/2017 0930   TRIG 89 03/07/2017 0930   HDL 67 03/07/2017 0930   LDLCALC  98 03/07/2017 0930   Hepatic Function Panel     Component Value Date/Time   PROT 6.5 06/16/2017 0945   ALBUMIN 3.8 06/16/2017 0945   AST 11 06/16/2017 0945   ALT 9 06/16/2017 0945   ALKPHOS 103 06/16/2017 0945   BILITOT <0.2 06/16/2017 0945      Component Value Date/Time   TSH 4.100 06/16/2017 0945   TSH 3.440 03/07/2017 0930    ASSESSMENT AND PLAN: Other depression - with emotional eating - Plan: buPROPion (WELLBUTRIN SR) 150 MG 12 hr tablet  Class 2 severe obesity with serious comorbidity and body mass index (BMI) of 36.0 to 36.9 in adult, unspecified obesity type ([place])  PLAN: Depression with Emotional Eating Behaviors We discussed behavior modification techniques today to help Jenna Chang deal with her emotional eating and depression. She has agreed to continue Wellbutrin SR 150 mg qd #30 with no refills and agreed to follow up as directed.  Obesity Jenna Chang is currently in the action stage of change. As such, her goal is to continue with weight loss efforts She has agreed to keep a food journal with 400-500 calories and 35 grams of protein at supper and follow the Category 3 plan Jenna Chang has been instructed to work up to a goal of 150 minutes of combined cardio and start strengthening exercise per week for weight loss and overall health benefits. We discussed the following Behavioral Modification Strategies today: increasing lean protein intake and decreasing simple carbohydrates and keeping a strict food journal.    Jamie has agreed to follow up  with our clinic in 2 weeks. She was informed of the importance of frequent follow up visits to maximize her success with intensive lifestyle modifications for her multiple health conditions.   OBESITY BEHAVIORAL INTERVENTION VISIT  Today's visit was # 7 out of 22.  Starting weight: 232 lbs Starting date: 03/07/17 Today's weight :205 lbs Today's date: 07/07/2017 Total lbs lost to date: 75 (Patients must lose 7 lbs in the first 6  months to continue with counseling)   ASK: We discussed the diagnosis of obesity with Jenna Chang today and Jenna Chang agreed to give [place] permission to discuss obesity behavioral modification therapy today.  ASSESS: Jenna Chang has the diagnosis of obesity and her BMI today is 36.31 Jenna Chang is in the action stage of change   ADVISE: Jenna Chang was educated on the multiple health risks of obesity as well as the benefit of weight loss to improve her health. She was advised of the need for long term treatment and the importance of lifestyle modifications.  AGREE: Multiple dietary modification options and treatment options were discussed and  Jenna Chang agreed to the above obesity treatment plan.  I, Renee Ramus, am acting as transcriptionist for Dennard Nip, MD  I have reviewed the above documentation for accuracy and completeness, and I agree with the above. -Dennard Nip, MD

## 2017-08-03 ENCOUNTER — Ambulatory Visit (INDEPENDENT_AMBULATORY_CARE_PROVIDER_SITE_OTHER): Payer: BC Managed Care – PPO | Admitting: Family Medicine

## 2017-08-03 VITALS — BP 114/77 | HR 78 | Temp 97.8°F | Ht 63.0 in | Wt 210.0 lb

## 2017-08-03 DIAGNOSIS — F3289 Other specified depressive episodes: Secondary | ICD-10-CM

## 2017-08-03 DIAGNOSIS — Z6837 Body mass index (BMI) 37.0-37.9, adult: Secondary | ICD-10-CM

## 2017-08-03 MED ORDER — BUPROPION HCL ER (SR) 150 MG PO TB12: 150.0000 mg | ORAL_TABLET | Freq: Every day | ORAL | 0 refills | Status: DC

## 2017-08-03 NOTE — Progress Notes (Signed)
Office: [phone]  /  Fax: [phone]   HPI:   Chief Complaint: OBESITY Jenna Chang is here to discuss her progress with her obesity treatment plan. She is on the keep a food journal with 400-500 calories and 35 grams of protein at supper daily and follow the Category 3 plan and is following her eating plan approximately 95 % of the time. She states she is walking for 20 minutes 3 times per week. Antara has been sick and not eating protein goal, has increase simple carbohydrates while recovering. She is still on antibiotics for bronchitis but ready to start back to her diet prescription.  Her weight is 210 lb (95.3 kg) today and has gained 5 pounds since her last visit. She has lost 22 lbs since starting treatment with [place].  Depression with emotional eating behaviors Mikiyah's mood is stable on Wellbutrin and she denies insomnia and blood pressure is stable. She has had an increase in emotional eating while sick. Aveah struggles with emotional eating and using food for comfort to the extent that it is negatively impacting her health. She often snacks when she is not hungry. Lisbet sometimes feels she is out of control and then feels guilty that she made poor food choices. She has been working on behavior modification techniques to help reduce her emotional eating and has been somewhat successful. She shows no sign of suicidal or homicidal ideations.  Depression screen PHQ 2/9 03/07/2017  Decreased Interest 3  Down, Depressed, Hopeless 0  PHQ - 2 Score 3  Altered sleeping 3  Tired, decreased energy 3  Change in appetite 2  Feeling bad or failure about yourself  1  Trouble concentrating 0  Moving slowly or fidgety/restless 2  Suicidal thoughts 0  PHQ-9 Score 14  Difficult doing work/chores Somewhat difficult  Some encounter information is confidential and restricted. Go to Review Flowsheets activity to see all data.   ALLERGIES: Allergies  Allergen Reactions  . Trovafloxacin  Anaphylaxis, Itching and Swelling    IV dosing only Tolerates levaquin without problems.  . Cefdinir Nausea And Vomiting  . Amlodipine Swelling  . Amlodipine Besylate Swelling  . Hydrochlorothiazide   . Losartan Cough  . Lisinopril Cough    MEDICATIONS: Current Outpatient Medications on File Prior to Visit  Medication Sig Dispense Refill  . albuterol (PROVENTIL HFA;VENTOLIN HFA) 108 (90 Base) MCG/ACT inhaler Inhale into the lungs every 6 (six) hours as needed for wheezing or shortness of breath.    . brimonidine (ALPHAGAN) 0.2 % ophthalmic solution 3 (three) times daily.    . calcium-vitamin D 250-100 MG-UNIT tablet Take 1 tablet by mouth 2 (two) times daily.    . carvedilol (COREG) 6.25 MG tablet Take 12.5 mg 2 (two) times daily with a meal by mouth.     . cholecalciferol (VITAMIN D) 1000 units tablet Take by mouth 2 (two) times daily.     . cyclobenzaprine (FLEXERIL) 5 MG tablet Take 5 mg by mouth 3 (three) times daily as needed for muscle spasms.    Marland Kitchen desmopressin (DDAVP) 0.2 MG tablet Take 0.2 mg by mouth daily.    . furosemide (LASIX) 20 MG tablet Take 20 mg by mouth daily.    Marland Kitchen HYDROcodone-acetaminophen (NORCO) 7.5-325 MG tablet Take 1 tablet by mouth every 6 (six) hours as needed.     Marland Kitchen levocetirizine (XYZAL) 5 MG tablet Take 5 mg by mouth.    . magnesium 30 MG tablet Take 30 mg by mouth 2 (two) times daily.    Marland Kitchen  mirabegron ER (MYRBETRIQ) 50 MG TB24 tablet Take by mouth.    . Ospemifene (OSPHENA) 60 MG TABS Take 1 tablet by mouth daily.    . pantoprazole (PROTONIX) 40 MG tablet Take by mouth 2 (two) times daily.     . polycarbophil (FIBERCON) 625 MG tablet Take 1,250 mg by mouth daily.    . traZODone (DESYREL) 50 MG tablet Take 1-2 tablets nightly at hour of sleep    . valACYclovir (VALTREX) 1000 MG tablet take 1 tablet by mouth twice a day if needed    . verapamil (CALAN) 120 MG tablet Take 240 mg once by mouth.      No current facility-administered medications on file prior  to visit.     PAST MEDICAL HISTORY: Past Medical History:  Diagnosis Date  . Acid reflux   . Anemia   . Anxiety   . Back pain   . Bilateral swelling of feet   . Bursitis   . Cataract   . Chest pain   . Constipation   . Cough   . DDD (degenerative disc disease), cervical   . Depression   . Ear pain   . Gallbladder disease   . GERD (gastroesophageal reflux disease)   . Hiatal hernia   . HTN (hypertension)   . IBS (irritable bowel syndrome)   . IC (interstitial cystitis)   . Low sodium levels   . Multinodular goiter   . OA (osteoarthritis)   . Osteopenia   . Scoliosis   . Sleep apnea   . Spinal stenosis   . Vitamin D deficiency     PAST SURGICAL HISTORY: Past Surgical History:  Procedure Laterality Date  . APPENDECTOMY    . CESAREAN SECTION    . CHOLECYSTECTOMY    . COLOSTOMY    . COLOSTOMY CLOSURE    . COLOSTOMY REVERSAL    . DG SCOLIOSIS SERIES ([place] HX)     rods placed  . HERNIA REPAIR    . INTERSTIM IMPLANT PLACEMENT    . NASAL SEPTUM SURGERY    . PELVIC ABCESS DRAINAGE    . TONSILECTOMY, ADENOIDECTOMY, BILATERAL MYRINGOTOMY AND TUBES    . TYMPANOSTOMY TUBE PLACEMENT    . vascular leg      SOCIAL HISTORY: Social History   Tobacco Use  . Smoking status: Not on file  Substance Use Topics  . Alcohol use: Not on file  . Drug use: Not on file    FAMILY HISTORY: Family History  Problem Relation Age of Onset  . Hypertension Mother   . Heart disease Mother   . Stroke Mother   . Cancer Mother   . Anxiety disorder Mother   . Sleep apnea Mother   . Hyperlipidemia Father   . Hypertension Father   . Heart disease Father   . Thyroid disease Father   . Kidney disease Father   . Cancer Father     ROS: Review of Systems  Constitutional: Negative for weight loss.  Psychiatric/Behavioral: Positive for depression. Negative for suicidal ideas.    PHYSICAL EXAM: Blood pressure 114/77, pulse 78, temperature 97.8 F (36.6 C), temperature source Oral,  height 5\' 3"  (1.6 m), weight 210 lb (95.3 kg), SpO2 97 %. Body mass index is 37.2 kg/m. Physical Exam  Constitutional: She is oriented to person, place, and time. She appears well-developed and well-nourished.  Cardiovascular: Normal rate.  Pulmonary/Chest: Effort normal.  Musculoskeletal: Normal range of motion.  Neurological: She is oriented to person, place, and time.  Skin: Skin is warm and dry.  Psychiatric: She has a normal mood and affect. Her behavior is normal.  Vitals reviewed.   RECENT LABS AND TESTS: BMET    Component Value Date/Time   NA 138 06/16/2017 0945   K 4.1 06/16/2017 0945   CL 100 06/16/2017 0945   CO2 24 06/16/2017 0945   GLUCOSE 89 06/16/2017 0945   BUN 25 06/16/2017 0945   CREATININE 0.82 06/16/2017 0945   CALCIUM 9.1 06/16/2017 0945   GFRNONAA 78 06/16/2017 0945   GFRAA 90 06/16/2017 0945   Lab Results  Component Value Date   HGBA1C 5.6 06/16/2017   HGBA1C 5.2 03/07/2017   Lab Results  Component Value Date   INSULIN 7.6 06/16/2017   INSULIN 7.8 03/07/2017   CBC    Component Value Date/Time   WBC 9.5 06/16/2017 0945   RBC 4.58 06/16/2017 0945   HGB 12.3 06/16/2017 0945   HCT 38.7 06/16/2017 0945   MCV 85 06/16/2017 0945   MCH 26.9 06/16/2017 0945   MCHC 31.8 06/16/2017 0945   RDW 15.1 06/16/2017 0945   LYMPHSABS 2.6 06/16/2017 0945   EOSABS 0.3 06/16/2017 0945   BASOSABS 0.0 06/16/2017 0945   Iron/TIBC/Ferritin/ %Sat    Component Value Date/Time   IRON 73 03/07/2017 0930   TIBC 401 03/07/2017 0930   FERRITIN 17 03/07/2017 0930   IRONPCTSAT 18 03/07/2017 0930   Lipid Panel     Component Value Date/Time   CHOL 183 03/07/2017 0930   TRIG 89 03/07/2017 0930   HDL 67 03/07/2017 0930   LDLCALC 98 03/07/2017 0930   Hepatic Function Panel     Component Value Date/Time   PROT 6.5 06/16/2017 0945   ALBUMIN 3.8 06/16/2017 0945   AST 11 06/16/2017 0945   ALT 9 06/16/2017 0945   ALKPHOS 103 06/16/2017 0945   BILITOT <0.2  06/16/2017 0945      Component Value Date/Time   TSH 4.100 06/16/2017 0945   TSH 3.440 03/07/2017 0930    ASSESSMENT AND PLAN: Other depression - with emotional eating - Plan: buPROPion (WELLBUTRIN SR) 150 MG 12 hr tablet  Class 2 severe obesity with serious comorbidity and body mass index (BMI) of 37.0 to 37.9 in adult, unspecified obesity type ([place])  PLAN:  Depression with Emotional Eating Behaviors We discussed behavior modification techniques today to help Jadis deal with her emotional eating and depression. Ludwika agrees to continue talking Wellbutrin SR 150 mg qd #30 and we will refill for 1 month. Sherry agrees to follow up with our clinic in 2 weeks.  Obesity Angelly is currently in the action stage of change. As such, her goal is to continue with weight loss efforts She has agreed to follow the Category 3 plan Emmerson has been instructed to work up to a goal of 150 minutes of combined cardio and strengthening exercise per week for weight loss and overall health benefits. We discussed the following Behavioral Modification Strategies today: increasing lean protein intake, decreasing simple carbohydrates, emotional eating strategies, increase H20 intake, and ways to avoid night time snacking   Shekina has agreed to follow up with our clinic in 2 weeks. She was informed of the importance of frequent follow up visits to maximize her success with intensive lifestyle modifications for her multiple health conditions.   OBESITY BEHAVIORAL INTERVENTION VISIT  Today's visit was # 8 out of 22.  Starting weight: 232 lbs Starting date: 03/07/17 Today's weight : 210 lbs Today's date: 08/03/2017 Total lbs  lost to date: 47 (Patients must lose 7 lbs in the first 6 months to continue with counseling)   ASK: We discussed the diagnosis of obesity with [name] today and Donni agreed to give [place] permission to discuss obesity behavioral modification therapy today.  ASSESS: Meaghann has  the diagnosis of obesity and her BMI today is 37.21 Lawrence is in the action stage of change   ADVISE: Viva was educated on the multiple health risks of obesity as well as the benefit of weight loss to improve her health. She was advised of the need for long term treatment and the importance of lifestyle modifications.  AGREE: Multiple dietary modification options and treatment options were discussed and  Anayi agreed to the above obesity treatment plan.  I, Trixie Dredge, am acting as transcriptionist for Dennard Nip, MD  I have reviewed the above documentation for accuracy and completeness, and I agree with the above. -Dennard Nip, MD

## 2017-08-24 ENCOUNTER — Ambulatory Visit (INDEPENDENT_AMBULATORY_CARE_PROVIDER_SITE_OTHER): Payer: BC Managed Care – PPO | Admitting: Family Medicine

## 2017-08-24 VITALS — BP 121/78 | HR 71 | Temp 97.9°F | Ht 63.0 in | Wt 207.0 lb

## 2017-08-24 DIAGNOSIS — F3289 Other specified depressive episodes: Secondary | ICD-10-CM

## 2017-08-24 DIAGNOSIS — Z9189 Other specified personal risk factors, not elsewhere classified: Secondary | ICD-10-CM | POA: Diagnosis not present

## 2017-08-24 DIAGNOSIS — Z6836 Body mass index (BMI) 36.0-36.9, adult: Secondary | ICD-10-CM | POA: Diagnosis not present

## 2017-08-24 MED ORDER — BUPROPION HCL ER (SR) 150 MG PO TB12: 150.0000 mg | ORAL_TABLET | Freq: Every day | ORAL | 0 refills | Status: DC

## 2017-08-24 NOTE — Progress Notes (Signed)
Office: [phone]  /  Fax: [phone]   HPI:   Chief Complaint: OBESITY Jenna Chang is here to discuss her progress with her obesity treatment plan. She is on the Category 3 plan and is following her eating plan approximately 97 % of the time. She states she is doing strength training for 60 minutes 2-3 times per week. Jenna Chang has been doing better with her weight loss efforts. She has been on prednisone recently but has been able to get back on track. Hunger is controlled but she will be traveling soon and will have extra temptations.  Her weight is 207 lb (93.9 kg) today and has had a weight loss of 3 pounds over a period of 3 weeks since her last visit. She has lost 25 lbs since starting treatment with [place].  Depression with emotional eating behaviors Jenna Chang's mood is stable, doing well controlling emotional eating. She denies insomnia and blood pressure is stable. Jenna Chang struggles with emotional eating and using food for comfort to the extent that it is negatively impacting her health. She often snacks when she is not hungry. Jenna Chang sometimes feels she is out of control and then feels guilty that she made poor food choices. She has been working on behavior modification techniques to help reduce her emotional eating and has been somewhat successful. She shows no sign of suicidal or homicidal ideations.  Depression screen PHQ 2/9 03/07/2017  Decreased Interest 3  Down, Depressed, Hopeless 0  PHQ - 2 Score 3  Altered sleeping 3  Tired, decreased energy 3  Change in appetite 2  Feeling bad or failure about yourself  1  Trouble concentrating 0  Moving slowly or fidgety/restless 2  Suicidal thoughts 0  PHQ-9 Score 14  Difficult doing work/chores Somewhat difficult  Some encounter information is confidential and restricted. Go to Review Flowsheets activity to see all data.   At risk for cardiovascular disease Jenna Chang is at a higher than average risk for cardiovascular disease due to  obesity. She currently denies any chest pain.  ALLERGIES: Allergies  Allergen Reactions  . Trovafloxacin Anaphylaxis, Itching and Swelling    IV dosing only Tolerates levaquin without problems.  . Cefdinir Nausea And Vomiting  . Amlodipine Swelling  . Amlodipine Besylate Swelling  . Hydrochlorothiazide   . Losartan Cough  . Lisinopril Cough    MEDICATIONS: Current Outpatient Medications on File Prior to Visit  Medication Sig Dispense Refill  . albuterol (PROVENTIL HFA;VENTOLIN HFA) 108 (90 Base) MCG/ACT inhaler Inhale into the lungs every 6 (six) hours as needed for wheezing or shortness of breath.    . brimonidine (ALPHAGAN) 0.2 % ophthalmic solution 3 (three) times daily.    . calcium-vitamin D 250-100 MG-UNIT tablet Take 1 tablet by mouth 2 (two) times daily.    . carvedilol (COREG) 6.25 MG tablet Take 12.5 mg 2 (two) times daily with a meal by mouth.     . cholecalciferol (VITAMIN D) 1000 units tablet Take by mouth 2 (two) times daily.     . cyclobenzaprine (FLEXERIL) 5 MG tablet Take 5 mg by mouth 3 (three) times daily as needed for muscle spasms.    Marland Kitchen desmopressin (DDAVP) 0.2 MG tablet Take 0.2 mg by mouth daily.    . furosemide (LASIX) 20 MG tablet Take 20 mg by mouth daily.    Marland Kitchen HYDROcodone-acetaminophen (NORCO) 7.5-325 MG tablet Take 1 tablet by mouth every 6 (six) hours as needed.     Marland Kitchen levocetirizine (XYZAL) 5 MG tablet Take 5 mg  by mouth.    . magnesium 30 MG tablet Take 30 mg by mouth 2 (two) times daily.    . mirabegron ER (MYRBETRIQ) 50 MG TB24 tablet Take by mouth.    . Ospemifene (OSPHENA) 60 MG TABS Take 1 tablet by mouth daily.    . pantoprazole (PROTONIX) 40 MG tablet Take by mouth 2 (two) times daily.     . polycarbophil (FIBERCON) 625 MG tablet Take 1,250 mg by mouth daily.    . traZODone (DESYREL) 50 MG tablet Take 1-2 tablets nightly at hour of sleep    . valACYclovir (VALTREX) 1000 MG tablet take 1 tablet by mouth twice a day if needed    . verapamil  (CALAN) 120 MG tablet Take 240 mg once by mouth.      No current facility-administered medications on file prior to visit.     PAST MEDICAL HISTORY: Past Medical History:  Diagnosis Date  . Acid reflux   . Anemia   . Anxiety   . Back pain   . Bilateral swelling of feet   . Bursitis   . Cataract   . Chest pain   . Constipation   . Cough   . DDD (degenerative disc disease), cervical   . Depression   . Ear pain   . Gallbladder disease   . GERD (gastroesophageal reflux disease)   . Hiatal hernia   . HTN (hypertension)   . IBS (irritable bowel syndrome)   . IC (interstitial cystitis)   . Low sodium levels   . Multinodular goiter   . OA (osteoarthritis)   . Osteopenia   . Scoliosis   . Sleep apnea   . Spinal stenosis   . Vitamin D deficiency     PAST SURGICAL HISTORY: Past Surgical History:  Procedure Laterality Date  . APPENDECTOMY    . CESAREAN SECTION    . CHOLECYSTECTOMY    . COLOSTOMY    . COLOSTOMY CLOSURE    . COLOSTOMY REVERSAL    . DG SCOLIOSIS SERIES ([place] HX)     rods placed  . HERNIA REPAIR    . INTERSTIM IMPLANT PLACEMENT    . NASAL SEPTUM SURGERY    . PELVIC ABCESS DRAINAGE    . TONSILECTOMY, ADENOIDECTOMY, BILATERAL MYRINGOTOMY AND TUBES    . TYMPANOSTOMY TUBE PLACEMENT    . vascular leg      SOCIAL HISTORY: Social History   Tobacco Use  . Smoking status: Not on file  Substance Use Topics  . Alcohol use: Not on file  . Drug use: Not on file    FAMILY HISTORY: Family History  Problem Relation Age of Onset  . Hypertension Mother   . Heart disease Mother   . Stroke Mother   . Cancer Mother   . Anxiety disorder Mother   . Sleep apnea Mother   . Hyperlipidemia Father   . Hypertension Father   . Heart disease Father   . Thyroid disease Father   . Kidney disease Father   . Cancer Father     ROS: Review of Systems  Constitutional: Positive for weight loss.  Cardiovascular: Negative for chest pain.  Psychiatric/Behavioral:  Positive for depression. Negative for suicidal ideas. The patient does not have insomnia.     PHYSICAL EXAM: Blood pressure 121/78, pulse 71, temperature 97.9 F (36.6 C), temperature source Oral, height 5\' 3"  (1.6 m), weight 207 lb (93.9 kg), SpO2 98 %. Body mass index is 36.67 kg/m. Physical Exam  Constitutional: She is  oriented to person, place, and time. She appears well-developed and well-nourished.  Cardiovascular: Normal rate.  Pulmonary/Chest: Effort normal.  Musculoskeletal: Normal range of motion.  Neurological: She is oriented to person, place, and time.  Skin: Skin is warm and dry.  Psychiatric: She has a normal mood and affect. Her behavior is normal.  Vitals reviewed.   RECENT LABS AND TESTS: BMET    Component Value Date/Time   NA 138 06/16/2017 0945   K 4.1 06/16/2017 0945   CL 100 06/16/2017 0945   CO2 24 06/16/2017 0945   GLUCOSE 89 06/16/2017 0945   BUN 25 06/16/2017 0945   CREATININE 0.82 06/16/2017 0945   CALCIUM 9.1 06/16/2017 0945   GFRNONAA 78 06/16/2017 0945   GFRAA 90 06/16/2017 0945   Lab Results  Component Value Date   HGBA1C 5.6 06/16/2017   HGBA1C 5.2 03/07/2017   Lab Results  Component Value Date   INSULIN 7.6 06/16/2017   INSULIN 7.8 03/07/2017   CBC    Component Value Date/Time   WBC 9.5 06/16/2017 0945   RBC 4.58 06/16/2017 0945   HGB 12.3 06/16/2017 0945   HCT 38.7 06/16/2017 0945   MCV 85 06/16/2017 0945   MCH 26.9 06/16/2017 0945   MCHC 31.8 06/16/2017 0945   RDW 15.1 06/16/2017 0945   LYMPHSABS 2.6 06/16/2017 0945   EOSABS 0.3 06/16/2017 0945   BASOSABS 0.0 06/16/2017 0945   Iron/TIBC/Ferritin/ %Sat    Component Value Date/Time   IRON 73 03/07/2017 0930   TIBC 401 03/07/2017 0930   FERRITIN 17 03/07/2017 0930   IRONPCTSAT 18 03/07/2017 0930   Lipid Panel     Component Value Date/Time   CHOL 183 03/07/2017 0930   TRIG 89 03/07/2017 0930   HDL 67 03/07/2017 0930   LDLCALC 98 03/07/2017 0930   Hepatic  Function Panel     Component Value Date/Time   PROT 6.5 06/16/2017 0945   ALBUMIN 3.8 06/16/2017 0945   AST 11 06/16/2017 0945   ALT 9 06/16/2017 0945   ALKPHOS 103 06/16/2017 0945   BILITOT <0.2 06/16/2017 0945      Component Value Date/Time   TSH 4.100 06/16/2017 0945   TSH 3.440 03/07/2017 0930    ASSESSMENT AND PLAN: Other depression - with emotional eating - Plan: buPROPion (WELLBUTRIN SR) 150 MG 12 hr tablet  At risk for heart disease  Class 2 severe obesity with serious comorbidity and body mass index (BMI) of 36.0 to 36.9 in adult, unspecified obesity type ([place])  PLAN:  Depression with Emotional Eating Behaviors We discussed behavior modification techniques today to help Jenna Chang deal with her emotional eating and depression. Jenna Chang agrees to continue taking Wellbutrin SR 150 mg qd #30 and we will refill for 1 month. Jenna Chang agrees to follow up with our clinic in 2 to 3 weeks.   Cardiovascular risk counselling Jenna Chang was given extended (15 minutes) coronary artery disease prevention counseling today. She is 62 y.o. female and has risk factors for heart disease including obesity. We discussed intensive lifestyle modifications today with an emphasis on specific weight loss instructions and strategies. Pt was also informed of the importance of increasing exercise and decreasing saturated fats to help prevent heart disease.  Obesity Shamra is currently in the action stage of change. As such, her goal is to continue with weight loss efforts She has agreed to follow the Category 3 plan Jenna Chang has been instructed to work up to a goal of 150 minutes of combined cardio and  strengthening exercise per week for weight loss and overall health benefits. We discussed the following Behavioral Modification Strategies today: holiday eating strategies and holiday eating strategies   Jenna Chang has agreed to follow up with our clinic in 2 to 3 weeks. She was informed of the importance of frequent  follow up visits to maximize her success with intensive lifestyle modifications for her multiple health conditions.   OBESITY BEHAVIORAL INTERVENTION VISIT  Today's visit was # 9 out of 22.  Starting weight: 232 lbs Starting date: 03/07/17 Today's weight : 207 lbs Today's date: 08/24/2017 Total lbs lost to date: 25 (Patients must lose 7 lbs in the first 6 months to continue with counseling)   ASK: We discussed the diagnosis of obesity with Jenna Chang today and Jenna Chang agreed to give [place] permission to discuss obesity behavioral modification therapy today.  ASSESS: Aradhya has the diagnosis of obesity and her BMI today is 36.68 [employer] is in the action stage of change   ADVISE: Susan was educated on the multiple health risks of obesity as well as the benefit of weight loss to improve her health. She was advised of the need for long term treatment and the importance of lifestyle modifications.  AGREE: Multiple dietary modification options and treatment options were discussed and  Carlissa agreed to the above obesity treatment plan.  I, Jenna Chang, am acting as transcriptionist for Dennard Nip, MD  I have reviewed the above documentation for accuracy and completeness, and I agree with the above. -Dennard Nip, MD

## 2017-09-12 ENCOUNTER — Ambulatory Visit (INDEPENDENT_AMBULATORY_CARE_PROVIDER_SITE_OTHER): Payer: BC Managed Care – PPO | Admitting: Family Medicine

## 2017-09-12 VITALS — BP 116/78 | HR 71 | Temp 97.5°F | Ht 63.0 in | Wt 207.0 lb

## 2017-09-12 DIAGNOSIS — Z6836 Body mass index (BMI) 36.0-36.9, adult: Secondary | ICD-10-CM | POA: Diagnosis not present

## 2017-09-12 DIAGNOSIS — F3289 Other specified depressive episodes: Secondary | ICD-10-CM

## 2017-09-12 DIAGNOSIS — E669 Obesity, unspecified: Secondary | ICD-10-CM

## 2017-09-12 MED ORDER — BUPROPION HCL ER (SR) 150 MG PO TB12: 150.0000 mg | ORAL_TABLET | Freq: Every day | ORAL | 0 refills | Status: DC

## 2017-09-13 NOTE — Progress Notes (Signed)
Office: [phone]  /  Fax: [phone]   HPI:   Chief Complaint: OBESITY Jenna Chang is here to discuss her progress with her obesity treatment plan. She is on the Category 3 plan and is following her eating plan approximately 95 % of the time. She states she is exercising 0 minutes 0 times per week. Jenna Chang has been traveling and increased eating out the last few weeks. She also has been fighting bronchitis and on prednisone again. She had increased walking on vacation and has done well maintaining her weight.  Her weight is 207 lb (93.9 kg) today and has not lost weight since her last visit. She has lost 25 lbs since starting treatment with [place].  Depression with emotional eating behaviors Jenna Chang's mood is stable on Wellbutrin, she has been on prednisone multiple times this year which may effect mood. Frustrated with lack of weight loss. Jenna Chang struggles with emotional eating and using food for comfort to the extent that it is negatively impacting her health. She often snacks when she is not hungry. [name] sometimes feels she is out of control and then feels guilty that she made poor food choices. She has been working on behavior modification techniques to help reduce her emotional eating and has been somewhat successful. She shows no sign of suicidal or homicidal ideations.  Depression screen PHQ 2/9 03/07/2017  Decreased Interest 3  Down, Depressed, Hopeless 0  PHQ - 2 Score 3  Altered sleeping 3  Tired, decreased energy 3  Change in appetite 2  Feeling bad or failure about yourself  1  Trouble concentrating 0  Moving slowly or fidgety/restless 2  Suicidal thoughts 0  PHQ-9 Score 14  Difficult doing work/chores Somewhat difficult  Some encounter information is confidential and restricted. Go to Review Flowsheets activity to see all data.   ALLERGIES: Allergies  Allergen Reactions  . Trovafloxacin Anaphylaxis, Itching and Swelling    IV dosing only Tolerates levaquin without  problems.  . Cefdinir Nausea And Vomiting  . Amlodipine Swelling  . Amlodipine Besylate Swelling  . Hydrochlorothiazide   . Losartan Cough  . Lisinopril Cough    MEDICATIONS: Current Outpatient Medications on File Prior to Visit  Medication Sig Dispense Refill  . albuterol (PROVENTIL HFA;VENTOLIN HFA) 108 (90 Base) MCG/ACT inhaler Inhale into the lungs every 6 (six) hours as needed for wheezing or shortness of breath.    . brimonidine (ALPHAGAN) 0.2 % ophthalmic solution 3 (three) times daily.    . calcium-vitamin D 250-100 MG-UNIT tablet Take 1 tablet by mouth 2 (two) times daily.    . carvedilol (COREG) 6.25 MG tablet Take 12.5 mg 2 (two) times daily with a meal by mouth.     . cholecalciferol (VITAMIN D) 1000 units tablet Take by mouth 2 (two) times daily.     . cyclobenzaprine (FLEXERIL) 5 MG tablet Take 5 mg by mouth 3 (three) times daily as needed for muscle spasms.    Marland Kitchen desmopressin (DDAVP) 0.2 MG tablet Take 0.2 mg by mouth daily.    . furosemide (LASIX) 20 MG tablet Take 20 mg by mouth daily.    Marland Kitchen HYDROcodone-acetaminophen (NORCO) 7.5-325 MG tablet Take 1 tablet by mouth every 6 (six) hours as needed.     Marland Kitchen levocetirizine (XYZAL) 5 MG tablet Take 5 mg by mouth.    . magnesium 30 MG tablet Take 30 mg by mouth 2 (two) times daily.    . mirabegron ER (MYRBETRIQ) 50 MG TB24 tablet Take by mouth.    Marland Kitchen  Ospemifene (OSPHENA) 60 MG TABS Take 1 tablet by mouth daily.    . pantoprazole (PROTONIX) 40 MG tablet Take by mouth 2 (two) times daily.     . polycarbophil (FIBERCON) 625 MG tablet Take 1,250 mg by mouth daily.    . predniSONE (DELTASONE) 10 MG tablet Take 30 mg by mouth daily with breakfast.    . traZODone (DESYREL) 50 MG tablet Take 1-2 tablets nightly at hour of sleep    . valACYclovir (VALTREX) 1000 MG tablet take 1 tablet by mouth twice a day if needed    . verapamil (CALAN) 120 MG tablet Take 240 mg once by mouth.      No current facility-administered medications on file  prior to visit.     PAST MEDICAL HISTORY: Past Medical History:  Diagnosis Date  . Acid reflux   . Anemia   . Anxiety   . Back pain   . Bilateral swelling of feet   . Bursitis   . Cataract   . Chest pain   . Constipation   . Cough   . DDD (degenerative disc disease), cervical   . Depression   . Ear pain   . Gallbladder disease   . GERD (gastroesophageal reflux disease)   . Hiatal hernia   . HTN (hypertension)   . IBS (irritable bowel syndrome)   . IC (interstitial cystitis)   . Low sodium levels   . Multinodular goiter   . OA (osteoarthritis)   . Osteopenia   . Scoliosis   . Sleep apnea   . Spinal stenosis   . Vitamin D deficiency     PAST SURGICAL HISTORY: Past Surgical History:  Procedure Laterality Date  . APPENDECTOMY    . CESAREAN SECTION    . CHOLECYSTECTOMY    . COLOSTOMY    . COLOSTOMY CLOSURE    . COLOSTOMY REVERSAL    . DG SCOLIOSIS SERIES ([place] HX)     rods placed  . HERNIA REPAIR    . INTERSTIM IMPLANT PLACEMENT    . NASAL SEPTUM SURGERY    . PELVIC ABCESS DRAINAGE    . TONSILECTOMY, ADENOIDECTOMY, BILATERAL MYRINGOTOMY AND TUBES    . TYMPANOSTOMY TUBE PLACEMENT    . vascular leg      SOCIAL HISTORY: Social History   Tobacco Use  . Smoking status: Not on file  Substance Use Topics  . Alcohol use: Not on file  . Drug use: Not on file    FAMILY HISTORY: Family History  Problem Relation Age of Onset  . Hypertension Mother   . Heart disease Mother   . Stroke Mother   . Cancer Mother   . Anxiety disorder Mother   . Sleep apnea Mother   . Hyperlipidemia Father   . Hypertension Father   . Heart disease Father   . Thyroid disease Father   . Kidney disease Father   . Cancer Father     ROS: Review of Systems  Constitutional: Negative for weight loss.  Psychiatric/Behavioral: Positive for depression. Negative for suicidal ideas.    PHYSICAL EXAM: Blood pressure 116/78, pulse 71, temperature (!) 97.5 F (36.4 C), temperature  source Oral, height 5\' 3"  (1.6 m), weight 207 lb (93.9 kg), SpO2 99 %. Body mass index is 36.67 kg/m. Physical Exam  Constitutional: She is oriented to person, place, and time. She appears well-developed and well-nourished.  Cardiovascular: Normal rate.  Pulmonary/Chest: Effort normal.  Musculoskeletal: Normal range of motion.  Neurological: She is oriented to person,  place, and time.  Skin: Skin is warm and dry.  Psychiatric: She has a normal mood and affect. Her behavior is normal.  Vitals reviewed.   RECENT LABS AND TESTS: BMET    Component Value Date/Time   NA 138 06/16/2017 0945   K 4.1 06/16/2017 0945   CL 100 06/16/2017 0945   CO2 24 06/16/2017 0945   GLUCOSE 89 06/16/2017 0945   BUN 25 06/16/2017 0945   CREATININE 0.82 06/16/2017 0945   CALCIUM 9.1 06/16/2017 0945   GFRNONAA 78 06/16/2017 0945   GFRAA 90 06/16/2017 0945   Lab Results  Component Value Date   HGBA1C 5.6 06/16/2017   HGBA1C 5.2 03/07/2017   Lab Results  Component Value Date   INSULIN 7.6 06/16/2017   INSULIN 7.8 03/07/2017   CBC    Component Value Date/Time   WBC 9.5 06/16/2017 0945   RBC 4.58 06/16/2017 0945   HGB 12.3 06/16/2017 0945   HCT 38.7 06/16/2017 0945   MCV 85 06/16/2017 0945   MCH 26.9 06/16/2017 0945   MCHC 31.8 06/16/2017 0945   RDW 15.1 06/16/2017 0945   LYMPHSABS 2.6 06/16/2017 0945   EOSABS 0.3 06/16/2017 0945   BASOSABS 0.0 06/16/2017 0945   Iron/TIBC/Ferritin/ %Sat    Component Value Date/Time   IRON 73 03/07/2017 0930   TIBC 401 03/07/2017 0930   FERRITIN 17 03/07/2017 0930   IRONPCTSAT 18 03/07/2017 0930   Lipid Panel     Component Value Date/Time   CHOL 183 03/07/2017 0930   TRIG 89 03/07/2017 0930   HDL 67 03/07/2017 0930   LDLCALC 98 03/07/2017 0930   Hepatic Function Panel     Component Value Date/Time   PROT 6.5 06/16/2017 0945   ALBUMIN 3.8 06/16/2017 0945   AST 11 06/16/2017 0945   ALT 9 06/16/2017 0945   ALKPHOS 103 06/16/2017 0945    BILITOT <0.2 06/16/2017 0945      Component Value Date/Time   TSH 4.100 06/16/2017 0945   TSH 3.440 03/07/2017 0930    ASSESSMENT AND PLAN: Class 2 obesity without serious comorbidity with body mass index (BMI) of 36.0 to 36.9 in adult, unspecified obesity type  Other depression - with emotional eating - Plan: buPROPion (WELLBUTRIN SR) 150 MG 12 hr tablet  PLAN:  Depression with Emotional Eating Behaviors We discussed behavior modification techniques today to help Aolanis deal with her emotional eating and depression. Rhyann agrees to continue taking Wellbutrin SR 150 mg qd #30 and we will refill for 1 month. Jackie agrees to follow up with our clinic in 3 weeks.  Obesity Johnye is currently in the action stage of change. As such, her goal is to continue with weight loss efforts She has agreed to keep a food journal with 1200-1400 calories and 75+ grams of protein daily Michaelina has been instructed to work up to a goal of 150 minutes of combined cardio and strengthening exercise per week for weight loss and overall health benefits. We discussed the following Behavioral Modification Strategies today: increasing lean protein intake, decreasing simple carbohydrates, and travel eating strategies    Islam has agreed to follow up with our clinic in 3 weeks. She was informed of the importance of frequent follow up visits to maximize her success with intensive lifestyle modifications for her multiple health conditions.   OBESITY BEHAVIORAL INTERVENTION VISIT  Today's visit was # 10 out of 22.  Starting weight: 232 lbs Starting date: 03/07/17 Today's weight : 207 lbs Today's date: 09/12/2017  Total lbs lost to date: 25 (Patients must lose 7 lbs in the first 6 months to continue with counseling)   ASK: We discussed the diagnosis of obesity with [name] today and Arelyn agreed to give [place] permission to discuss obesity behavioral modification therapy today.  ASSESS: Jeanni has the  diagnosis of obesity and her BMI today is 36.68 [place] is in the action stage of change   ADVISE: Marella was educated on the multiple health risks of obesity as well as the benefit of weight loss to improve her health. She was advised of the need for long term treatment and the importance of lifestyle modifications.  AGREE: Multiple dietary modification options and treatment options were discussed and  Keiley agreed to the above obesity treatment plan.  I, Trixie Dredge, am acting as transcriptionist for Dennard Nip, MD  I have reviewed the above documentation for accuracy and completeness, and I agree with the above. -Dennard Nip, MD

## 2017-09-19 ENCOUNTER — Encounter (INDEPENDENT_AMBULATORY_CARE_PROVIDER_SITE_OTHER): Payer: Self-pay | Admitting: Family Medicine

## 2017-10-04 ENCOUNTER — Ambulatory Visit (INDEPENDENT_AMBULATORY_CARE_PROVIDER_SITE_OTHER): Payer: BC Managed Care – PPO | Admitting: Family Medicine

## 2017-10-04 VITALS — BP 123/81 | HR 65 | Temp 97.9°F | Ht 63.0 in | Wt 209.0 lb

## 2017-10-04 DIAGNOSIS — E559 Vitamin D deficiency, unspecified: Secondary | ICD-10-CM | POA: Diagnosis not present

## 2017-10-04 DIAGNOSIS — R5383 Other fatigue: Secondary | ICD-10-CM | POA: Diagnosis not present

## 2017-10-04 DIAGNOSIS — Z6837 Body mass index (BMI) 37.0-37.9, adult: Secondary | ICD-10-CM | POA: Diagnosis not present

## 2017-10-04 DIAGNOSIS — E7849 Other hyperlipidemia: Secondary | ICD-10-CM

## 2017-10-04 DIAGNOSIS — R7303 Prediabetes: Secondary | ICD-10-CM

## 2017-10-04 NOTE — Progress Notes (Signed)
Office: [phone]  /  Fax: [phone]   HPI:   Chief Complaint: OBESITY Jenna Chang is here to discuss her progress with her obesity treatment plan. She is on the keep a food journal with 1200 to 1400 calories and 75+ grams of protein daily and is following her eating plan approximately 90 % of the time. She states she is exercising 0 minutes 0 times per week. [name] is retaining fluid today. She has had a recent flare, and is finishing up her antibiotic today. [name] is journaling on and off, but she is not meeting her protein goal. Her weight is 209 lb (94.8 kg) today and has had a weight gain of 2 pounds over a period of 3 weeks since her last visit. She has lost 23 lbs since starting treatment with [place].  Fatigue [name] notes increased fatigue and is worse with recent infection, antibiotics and prednisone.   Vitamin D deficiency [name] has a diagnosis of vitamin D deficiency. She is currently taking OTC vit D and last level was at goal. [name]  denies nausea, vomiting or muscle weakness.  Pre-Diabetes [name] has a diagnosis of prediabetes based on her elevated Hgb A1c and was informed this puts her at greater risk of developing diabetes. She is attempting to control her pre-diabetes with diet. [name] continues to work on diet and exercise to decrease risk of diabetes. She still notes polyphagia and denies nausea or hypoglycemia.  Hyperlipidemia [name] has hyperlipidemia and is not on statin. She is attempting to control her cholesterol levels with intensive lifestyle modification including a low saturated fat diet, exercise and weight loss. She denies any chest pain, claudication or myalgias.  ALLERGIES: Allergies  Allergen Reactions  . Trovafloxacin Anaphylaxis, Itching and Swelling    IV dosing only Tolerates levaquin without problems.  . Cefdinir Nausea And Vomiting  . Amlodipine Swelling  . Amlodipine Besylate Swelling  . Hydrochlorothiazide   . Losartan Cough  . Lisinopril  Cough    MEDICATIONS: Current Outpatient Medications on File Prior to Visit  Medication Sig Dispense Refill  . Glycerin-Polysorbate 80 (REFRESH DRY EYE THERAPY OP) Apply 1 drop to eye 4 (four) times daily.    . travoprost, benzalkonium, (TRAVATAN) 0.004 % ophthalmic solution Place 1 drop into both eyes at bedtime.    Marland Kitchen albuterol (PROVENTIL HFA;VENTOLIN HFA) 108 (90 Base) MCG/ACT inhaler Inhale into the lungs every 6 (six) hours as needed for wheezing or shortness of breath.    . calcium-vitamin D 250-100 MG-UNIT tablet Take 1 tablet by mouth 2 (two) times daily.    . carvedilol (COREG) 6.25 MG tablet Take 12.5 mg 2 (two) times daily with a meal by mouth.     . cholecalciferol (VITAMIN D) 1000 units tablet Take by mouth 2 (two) times daily.     . cyclobenzaprine (FLEXERIL) 5 MG tablet Take 5 mg by mouth 3 (three) times daily as needed for muscle spasms.    Marland Kitchen desmopressin (DDAVP) 0.2 MG tablet Take 0.2 mg by mouth daily.    . furosemide (LASIX) 20 MG tablet Take 20 mg by mouth daily as needed.     Marland Kitchen HYDROcodone-acetaminophen (NORCO) 7.5-325 MG tablet Take 1 tablet by mouth every 6 (six) hours as needed.     Marland Kitchen levocetirizine (XYZAL) 5 MG tablet Take 5 mg by mouth.    . magnesium 30 MG tablet Take 30 mg by mouth 2 (two) times daily.    . mirabegron ER (MYRBETRIQ) 50 MG TB24 tablet Take by mouth.    Marland Kitchen  Ospemifene (OSPHENA) 60 MG TABS Take 1 tablet by mouth daily.    . pantoprazole (PROTONIX) 40 MG tablet Take by mouth 2 (two) times daily.     . polycarbophil (FIBERCON) 625 MG tablet Take 1,250 mg by mouth daily.    . traZODone (DESYREL) 50 MG tablet Take 1-2 tablets nightly at hour of sleep    . valACYclovir (VALTREX) 1000 MG tablet take 1 tablet by mouth twice a day if needed    . verapamil (CALAN) 120 MG tablet Take 240 mg once by mouth.      No current facility-administered medications on file prior to visit.     PAST MEDICAL HISTORY: Past Medical History:  Diagnosis Date  . Acid reflux    . Anemia   . Anxiety   . Back pain   . Bilateral swelling of feet   . Bursitis   . Cataract   . Chest pain   . Constipation   . Cough   . DDD (degenerative disc disease), cervical   . Depression   . Ear pain   . Gallbladder disease   . GERD (gastroesophageal reflux disease)   . Hiatal hernia   . HTN (hypertension)   . IBS (irritable bowel syndrome)   . IC (interstitial cystitis)   . Low sodium levels   . Multinodular goiter   . OA (osteoarthritis)   . Osteopenia   . Scoliosis   . Sleep apnea   . Spinal stenosis   . Vitamin D deficiency     PAST SURGICAL HISTORY: Past Surgical History:  Procedure Laterality Date  . APPENDECTOMY    . CESAREAN SECTION    . CHOLECYSTECTOMY    . COLOSTOMY    . COLOSTOMY CLOSURE    . COLOSTOMY REVERSAL    . DG SCOLIOSIS SERIES ([place] HX)     rods placed  . HERNIA REPAIR    . INTERSTIM IMPLANT PLACEMENT    . NASAL SEPTUM SURGERY    . PELVIC ABCESS DRAINAGE    . TONSILECTOMY, ADENOIDECTOMY, BILATERAL MYRINGOTOMY AND TUBES    . TYMPANOSTOMY TUBE PLACEMENT    . vascular leg      SOCIAL HISTORY: Social History   Tobacco Use  . Smoking status: Not on file  Substance Use Topics  . Alcohol use: Not on file  . Drug use: Not on file    FAMILY HISTORY: Family History  Problem Relation Age of Onset  . Hypertension Mother   . Heart disease Mother   . Stroke Mother   . Cancer Mother   . Anxiety disorder Mother   . Sleep apnea Mother   . Hyperlipidemia Father   . Hypertension Father   . Heart disease Father   . Thyroid disease Father   . Kidney disease Father   . Cancer Father     ROS: Review of Systems  Constitutional: Positive for malaise/fatigue. Negative for weight loss.  Cardiovascular: Negative for chest pain and claudication.  Gastrointestinal: Negative for nausea and vomiting.  Musculoskeletal: Negative for myalgias.       Negative for muscle weakness  Endo/Heme/Allergies:       Positive for polyphagia Negative  for hypoglycemia    PHYSICAL EXAM: Blood pressure 123/81, pulse 65, temperature 97.9 F (36.6 C), temperature source Oral, height 5\' 3"  (1.6 m), weight 209 lb (94.8 kg), SpO2 99 %. Body mass index is 37.02 kg/m. Physical Exam  Constitutional: She is oriented to person, place, and time. She appears well-developed and well-nourished.  Cardiovascular: Normal rate.  Pulmonary/Chest: Effort normal.  Musculoskeletal: Normal range of motion.  Neurological: She is oriented to person, place, and time.  Skin: Skin is warm and dry.  Psychiatric: She has a normal mood and affect. Her behavior is normal.  Vitals reviewed.   RECENT LABS AND TESTS: BMET    Component Value Date/Time   NA 138 06/16/2017 0945   K 4.1 06/16/2017 0945   CL 100 06/16/2017 0945   CO2 24 06/16/2017 0945   GLUCOSE 89 06/16/2017 0945   BUN 25 06/16/2017 0945   CREATININE 0.82 06/16/2017 0945   CALCIUM 9.1 06/16/2017 0945   GFRNONAA 78 06/16/2017 0945   GFRAA 90 06/16/2017 0945   Lab Results  Component Value Date   HGBA1C 5.6 06/16/2017   HGBA1C 5.2 03/07/2017   Lab Results  Component Value Date   INSULIN 7.6 06/16/2017   INSULIN 7.8 03/07/2017   CBC    Component Value Date/Time   WBC 9.5 06/16/2017 0945   RBC 4.58 06/16/2017 0945   HGB 12.3 06/16/2017 0945   HCT 38.7 06/16/2017 0945   MCV 85 06/16/2017 0945   MCH 26.9 06/16/2017 0945   MCHC 31.8 06/16/2017 0945   RDW 15.1 06/16/2017 0945   LYMPHSABS 2.6 06/16/2017 0945   EOSABS 0.3 06/16/2017 0945   BASOSABS 0.0 06/16/2017 0945   Iron/TIBC/Ferritin/ %Sat    Component Value Date/Time   IRON 73 03/07/2017 0930   TIBC 401 03/07/2017 0930   FERRITIN 17 03/07/2017 0930   IRONPCTSAT 18 03/07/2017 0930   Lipid Panel     Component Value Date/Time   CHOL 183 03/07/2017 0930   TRIG 89 03/07/2017 0930   HDL 67 03/07/2017 0930   LDLCALC 98 03/07/2017 0930   Hepatic Function Panel     Component Value Date/Time   PROT 6.5 06/16/2017 0945    ALBUMIN 3.8 06/16/2017 0945   AST 11 06/16/2017 0945   ALT 9 06/16/2017 0945   ALKPHOS 103 06/16/2017 0945   BILITOT <0.2 06/16/2017 0945      Component Value Date/Time   TSH 4.100 06/16/2017 0945   TSH 3.440 03/07/2017 0930   Results for MINAHIL, QUINLIVAN (MRN [id number]) as of 10/04/2017 13:39  Ref. Range 03/07/2017 09:30  Vitamin D, 25-Hydroxy Latest Ref Range: 30.0 - 100.0 ng/mL 60.7   ASSESSMENT AND PLAN: Other fatigue - Plan: CBC With Differential  Vitamin D deficiency - Plan: VITAMIN D 25 Hydroxy (Vit-D Deficiency, Fractures)  Prediabetes - Plan: Comprehensive metabolic panel, Hemoglobin A1c, Insulin, random  Other hyperlipidemia - Plan: Lipid Panel With LDL/HDL Ratio  Class 2 severe obesity with serious comorbidity and body mass index (BMI) of 37.0 to 37.9 in adult, unspecified obesity type (HCC)  PLAN:  Fatigue Marika was informed that her fatigue may be related to obesity, depression or many other causes. Labs will be ordered, and in the meanwhile Labrisha has agreed to work on diet, exercise and weight loss to help with fatigue.   Vitamin D Deficiency Eriona was informed that low vitamin D levels contributes to fatigue and are associated with obesity, breast, and colon cancer. She agrees to continue to take OTC Vit D and will follow up for routine testing of vitamin D, at least 2-3 times per year. We will check labs and follow. She was informed of the risk of over-replacement of vitamin D and agrees to not increase her dose unless she discusses this with [place] first.  Pre-Diabetes Orit will continue to work on weight loss,  exercise, and decreasing simple carbohydrates in her diet to help decrease the risk of diabetes. She was informed that eating too many simple carbohydrates or too many calories at one sitting increases the likelihood of GI side effects. We will check labs and Lucillie agreed to follow up with [place] as directed to monitor her progress.  Hyperlipidemia Mishel was  informed of the American Heart Association Guidelines emphasizing intensive lifestyle modifications as the first line treatment for hyperlipidemia. We discussed many lifestyle modifications today in depth, and Nayali will continue to work on decreasing saturated fats such as fatty red meat, butter and many fried foods. She will also increase vegetables and lean protein in her diet and continue to work on exercise and weight loss efforts. We will check labs and Kaelan will follow up as directed.  Obesity Markiya is currently in the action stage of change. As such, her goal is to continue with weight loss efforts She has agreed to keep a food journal with 1200 to 1400 calories and 75+ grams of protein daily Clorene has been instructed to work up to a goal of 150 minutes of combined cardio and strengthening exercise per week for weight loss and overall health benefits. We discussed the following Behavioral Modification Strategies today: increasing lean protein intake, decreasing simple carbohydrates , decrease eating out and work on meal planning and easy cooking plans  Joyous has agreed to follow up with our clinic in 3 weeks. She was informed of the importance of frequent follow up visits to maximize her success with intensive lifestyle modifications for her multiple health conditions.   OBESITY BEHAVIORAL INTERVENTION VISIT  Today's visit was # 11 out of 22.  Starting weight: 232 lbs Starting date: 03/07/17 Today's weight : 209 lbs  Today's date: 10/04/2017 Total lbs lost to date: 23 (Patients must lose 7 lbs in the first 6 months to continue with counseling)   ASK: We discussed the diagnosis of obesity with [name] today and Brilee agreed to give [place] permission to discuss obesity behavioral modification therapy today.  ASSESS: Melondy has the diagnosis of obesity and her BMI today is 37.03 Etola is in the action stage of change   ADVISE: Chantavia was educated on the multiple health  risks of obesity as well as the benefit of weight loss to improve her health. She was advised of the need for long term treatment and the importance of lifestyle modifications.  AGREE: Multiple dietary modification options and treatment options were discussed and  Nautica agreed to the above obesity treatment plan.  I, Doreene Nest, am acting as transcriptionist for Dennard Nip, MD  I have reviewed the above documentation for accuracy and completeness, and I agree with the above. -Dennard Nip, MD

## 2017-10-05 ENCOUNTER — Encounter (INDEPENDENT_AMBULATORY_CARE_PROVIDER_SITE_OTHER): Payer: Self-pay | Admitting: Family Medicine

## 2017-10-05 LAB — COMPREHENSIVE METABOLIC PANEL
ALT: 8 IU/L (ref 0–32)
AST: 11 IU/L (ref 0–40)
Albumin/Globulin Ratio: 1.5 (ref 1.2–2.2)
Albumin: 3.4 g/dL — ABNORMAL LOW (ref 3.6–4.8)
Alkaline Phosphatase: 77 IU/L (ref 39–117)
BUN/Creatinine Ratio: 31 — ABNORMAL HIGH (ref 12–28)
BUN: 18 mg/dL (ref 8–27)
Bilirubin Total: <0.2 mg/dL (ref 0.0–1.2)
CALCIUM: 8.8 mg/dL (ref 8.7–10.3)
CO2: 23 mmol/L (ref 20–29)
CREATININE: 0.59 mg/dL (ref 0.57–1.00)
Chloride: 98 mmol/L (ref 96–106)
GFR calc Af Amer: 114 mL/min/{1.73_m2} (ref >59)
GFR, EST NON AFRICAN AMERICAN: 99 mL/min/{1.73_m2} (ref >59)
GLOBULIN, TOTAL: 2.2 g/dL (ref 1.5–4.5)
Glucose: 82 mg/dL (ref 65–99)
Potassium: 3.8 mmol/L (ref 3.5–5.2)
Sodium: 135 mmol/L (ref 134–144)
TOTAL PROTEIN: 5.6 g/dL — AB (ref 6.0–8.5)

## 2017-10-05 LAB — CBC WITH DIFFERENTIAL
Basophils Absolute: 0 10*3/uL (ref 0.0–0.2)
Basos: 0 %
EOS (ABSOLUTE): 0.2 10*3/uL (ref 0.0–0.4)
EOS: 2 %
HEMATOCRIT: 36.6 % (ref 34.0–46.6)
Hemoglobin: 11.5 g/dL (ref 11.1–15.9)
IMMATURE GRANS (ABS): 0 10*3/uL (ref 0.0–0.1)
IMMATURE GRANULOCYTES: 0 %
LYMPHS: 34 %
Lymphocytes Absolute: 2.4 10*3/uL (ref 0.7–3.1)
MCH: 27.2 pg (ref 26.6–33.0)
MCHC: 31.4 g/dL — ABNORMAL LOW (ref 31.5–35.7)
MCV: 87 fL (ref 79–97)
MONOCYTES: 5 %
Monocytes Absolute: 0.4 10*3/uL (ref 0.1–0.9)
Neutrophils Absolute: 4.1 10*3/uL (ref 1.4–7.0)
Neutrophils: 59 %
RBC: 4.23 x10E6/uL (ref 3.77–5.28)
RDW: 17.6 % — ABNORMAL HIGH (ref 12.3–15.4)
WBC: 7.1 10*3/uL (ref 3.4–10.8)

## 2017-10-05 LAB — LIPID PANEL WITH LDL/HDL RATIO
Cholesterol, Total: 172 mg/dL (ref 100–199)
HDL: 63 mg/dL (ref >39)
LDL Calculated: 91 mg/dL (ref 0–99)
LDl/HDL Ratio: 1.4 ratio (ref 0.0–3.2)
Triglycerides: 89 mg/dL (ref 0–149)
VLDL Cholesterol Cal: 18 mg/dL (ref 5–40)

## 2017-10-05 LAB — VITAMIN D 25 HYDROXY (VIT D DEFICIENCY, FRACTURES): VIT D 25 HYDROXY: 88.6 ng/mL (ref 30.0–100.0)

## 2017-10-05 LAB — INSULIN, RANDOM: INSULIN: 7.7 u[IU]/mL (ref 2.6–24.9)

## 2017-10-05 LAB — HEMOGLOBIN A1C
ESTIMATED AVERAGE GLUCOSE: 108 mg/dL
Hgb A1c MFr Bld: 5.4 % (ref 4.8–5.6)

## 2017-10-13 ENCOUNTER — Encounter (INDEPENDENT_AMBULATORY_CARE_PROVIDER_SITE_OTHER): Payer: Self-pay | Admitting: Family Medicine

## 2017-10-25 ENCOUNTER — Ambulatory Visit (INDEPENDENT_AMBULATORY_CARE_PROVIDER_SITE_OTHER): Payer: BC Managed Care – PPO | Admitting: Family Medicine

## 2017-10-25 VITALS — BP 115/77 | HR 64 | Temp 98.4°F | Ht 63.0 in | Wt 206.0 lb

## 2017-10-25 DIAGNOSIS — R7303 Prediabetes: Secondary | ICD-10-CM | POA: Diagnosis not present

## 2017-10-25 DIAGNOSIS — Z6836 Body mass index (BMI) 36.0-36.9, adult: Secondary | ICD-10-CM

## 2017-10-26 NOTE — Progress Notes (Signed)
Office: [phone]  /  Fax: [phone]   HPI:   Chief Complaint: OBESITY Jenna Chang is here to discuss her progress with her obesity treatment plan. She is on the  keep a food journal with 1200 to 1400 calories and 75+ grams of protein daily and is following her eating plan approximately 75 % of the time. She states she is exercising 0 minutes 0 times per week. Jenna Chang continues to do well with weight loss, even with increased celebration eating. She is getting ready to travel and has questions about how to plan ahead. Her weight is 206 lb (93.4 kg) today and has had a weight loss of 3 pounds over a period of 3 weeks since her last visit. She has lost 26 lbs since starting treatment with [place].  Pre-Diabetes Jenna Chang has a diagnosis of prediabetes based on her elevated Hgb A1c and was informed this puts her at greater risk of developing diabetes. She is doing well with diet, exercise and weight loss and she denies hypoglycemia. Jenna Chang is doing well overall. She is not taking metformin currently and continues to work on diet and exercise to decrease risk of diabetes.   ALLERGIES: Allergies  Allergen Reactions  . Trovafloxacin Anaphylaxis, Itching and Swelling    IV dosing only Tolerates levaquin without problems.  . Cefdinir Nausea And Vomiting  . Amlodipine Swelling  . Amlodipine Besylate Swelling  . Hydrochlorothiazide   . Losartan Cough  . Lisinopril Cough    MEDICATIONS: Current Outpatient Medications on File Prior to Visit  Medication Sig Dispense Refill  . albuterol (PROVENTIL HFA;VENTOLIN HFA) 108 (90 Base) MCG/ACT inhaler Inhale into the lungs every 6 (six) hours as needed for wheezing or shortness of breath.    . calcium-vitamin D 250-100 MG-UNIT tablet Take 1 tablet by mouth 2 (two) times daily.    . carvedilol (COREG) 6.25 MG tablet Take 12.5 mg 2 (two) times daily with a meal by mouth.     . cholecalciferol (VITAMIN D) 1000 units tablet Take by mouth 2 (two) times daily.       . cyclobenzaprine (FLEXERIL) 5 MG tablet Take 5 mg by mouth 3 (three) times daily as needed for muscle spasms.    Marland Kitchen desmopressin (DDAVP) 0.2 MG tablet Take 0.2 mg by mouth daily.    . furosemide (LASIX) 20 MG tablet Take 20 mg by mouth daily as needed.     . Glycerin-Polysorbate 80 (REFRESH DRY EYE THERAPY OP) Apply 1 drop to eye 4 (four) times daily.    Marland Kitchen HYDROcodone-acetaminophen (NORCO) 7.5-325 MG tablet Take 1 tablet by mouth every 6 (six) hours as needed.     Marland Kitchen levocetirizine (XYZAL) 5 MG tablet Take 5 mg by mouth.    . magnesium 30 MG tablet Take 30 mg by mouth 2 (two) times daily.    . mirabegron ER (MYRBETRIQ) 50 MG TB24 tablet Take by mouth.    . Ospemifene (OSPHENA) 60 MG TABS Take 1 tablet by mouth daily.    . pantoprazole (PROTONIX) 40 MG tablet Take by mouth 2 (two) times daily.     . polycarbophil (FIBERCON) 625 MG tablet Take 1,250 mg by mouth daily.    . travoprost, benzalkonium, (TRAVATAN) 0.004 % ophthalmic solution Place 1 drop into both eyes at bedtime.    . valACYclovir (VALTREX) 1000 MG tablet take 1 tablet by mouth twice a day if needed    . verapamil (CALAN) 120 MG tablet Take 240 mg once by mouth.  No current facility-administered medications on file prior to visit.     PAST MEDICAL HISTORY: Past Medical History:  Diagnosis Date  . Acid reflux   . Anemia   . Anxiety   . Back pain   . Bilateral swelling of feet   . Bursitis   . Cataract   . Chest pain   . Constipation   . Cough   . DDD (degenerative disc disease), cervical   . Depression   . Ear pain   . Gallbladder disease   . GERD (gastroesophageal reflux disease)   . Hiatal hernia   . HTN (hypertension)   . IBS (irritable bowel syndrome)   . IC (interstitial cystitis)   . Low sodium levels   . Multinodular goiter   . OA (osteoarthritis)   . Osteopenia   . Scoliosis   . Sleep apnea   . Spinal stenosis   . Vitamin D deficiency     PAST SURGICAL HISTORY: Past Surgical History:   Procedure Laterality Date  . APPENDECTOMY    . CESAREAN SECTION    . CHOLECYSTECTOMY    . COLOSTOMY    . COLOSTOMY CLOSURE    . COLOSTOMY REVERSAL    . DG SCOLIOSIS SERIES ([place] HX)     rods placed  . HERNIA REPAIR    . INTERSTIM IMPLANT PLACEMENT    . NASAL SEPTUM SURGERY    . PELVIC ABCESS DRAINAGE    . TONSILECTOMY, ADENOIDECTOMY, BILATERAL MYRINGOTOMY AND TUBES    . TYMPANOSTOMY TUBE PLACEMENT    . vascular leg      SOCIAL HISTORY: Social History   Tobacco Use  . Smoking status: Not on file  Substance Use Topics  . Alcohol use: Not on file  . Drug use: Not on file    FAMILY HISTORY: Family History  Problem Relation Age of Onset  . Hypertension Mother   . Heart disease Mother   . Stroke Mother   . Cancer Mother   . Anxiety disorder Mother   . Sleep apnea Mother   . Hyperlipidemia Father   . Hypertension Father   . Heart disease Father   . Thyroid disease Father   . Kidney disease Father   . Cancer Father     ROS: Review of Systems  Constitutional: Positive for weight loss.  Endo/Heme/Allergies:       Negative for hypoglycemia    PHYSICAL EXAM: Blood pressure 115/77, pulse 64, temperature 98.4 F (36.9 C), temperature source Oral, height 5\' 3"  (1.6 m), weight 206 lb (93.4 kg), SpO2 97 %. Body mass index is 36.49 kg/m. Physical Exam  Constitutional: She is oriented to person, place, and time. She appears well-developed and well-nourished.  Cardiovascular: Normal rate.  Pulmonary/Chest: Effort normal.  Musculoskeletal: Normal range of motion.  Neurological: She is oriented to person, place, and time.  Skin: Skin is warm and dry.  Psychiatric: She has a normal mood and affect. Her behavior is normal.  Vitals reviewed.   RECENT LABS AND TESTS: BMET    Component Value Date/Time   NA 135 10/04/2017 0854   K 3.8 10/04/2017 0854   CL 98 10/04/2017 0854   CO2 23 10/04/2017 0854   GLUCOSE 82 10/04/2017 0854   BUN 18 10/04/2017 0854   CREATININE  0.59 10/04/2017 0854   CALCIUM 8.8 10/04/2017 0854   GFRNONAA 99 10/04/2017 0854   GFRAA 114 10/04/2017 0854   Lab Results  Component Value Date   HGBA1C 5.4 10/04/2017   HGBA1C  5.6 06/16/2017   HGBA1C 5.2 03/07/2017   Lab Results  Component Value Date   INSULIN 7.7 10/04/2017   INSULIN 7.6 06/16/2017   INSULIN 7.8 03/07/2017   CBC    Component Value Date/Time   WBC 7.1 10/04/2017 0854   RBC 4.23 10/04/2017 0854   HGB 11.5 10/04/2017 0854   HCT 36.6 10/04/2017 0854   MCV 87 10/04/2017 0854   MCH 27.2 10/04/2017 0854   MCHC 31.4 (L) 10/04/2017 0854   RDW 17.6 (H) 10/04/2017 0854   LYMPHSABS 2.4 10/04/2017 0854   EOSABS 0.2 10/04/2017 0854   BASOSABS 0.0 10/04/2017 0854   Iron/TIBC/Ferritin/ %Sat    Component Value Date/Time   IRON 73 03/07/2017 0930   TIBC 401 03/07/2017 0930   FERRITIN 17 03/07/2017 0930   IRONPCTSAT 18 03/07/2017 0930   Lipid Panel     Component Value Date/Time   CHOL 172 10/04/2017 0854   TRIG 89 10/04/2017 0854   HDL 63 10/04/2017 0854   LDLCALC 91 10/04/2017 0854   Hepatic Function Panel     Component Value Date/Time   PROT 5.6 (L) 10/04/2017 0854   ALBUMIN 3.4 (L) 10/04/2017 0854   AST 11 10/04/2017 0854   ALT 8 10/04/2017 0854   ALKPHOS 77 10/04/2017 0854   BILITOT <0.2 10/04/2017 0854      Component Value Date/Time   TSH 4.100 06/16/2017 0945   TSH 3.440 03/07/2017 0930   Results for TALISSA, APPLE (MRN [id number]) as of 10/26/2017 08:16  Ref. Range 10/04/2017 08:54  Vitamin D, 25-Hydroxy Latest Ref Range: 30.0 - 100.0 ng/mL 88.6   ASSESSMENT AND PLAN: Prediabetes  Class 2 severe obesity with serious comorbidity and body mass index (BMI) of 36.0 to 36.9 in adult, unspecified obesity type [facility])  PLAN:  Pre-Diabetes Robbin will continue to work on weight loss, exercise, and decreasing simple carbohydrates in her diet to help decrease the risk of diabetes. She was informed that eating too many simple carbohydrates or too  many calories at one sitting increases the likelihood of GI side effects. We will recheck labs in 3 months and Taniqua agreed to follow up with [place] as directed to monitor her progress.  We spent > than 50% of the 15 minute visit on the counseling as documented in the note.  Obesity Arlinda is currently in the action stage of change. As such, her goal is to continue with weight loss efforts She has agreed to follow the Category 3 plan Kaylei has been instructed to work up to a goal of 150 minutes of combined cardio and strengthening exercise per week for weight loss and overall health benefits. We discussed the following Behavioral Modification Strategies today: travel eating strategies and celebration eating strategies  Adaia has agreed to follow up with our clinic in 4 weeks. She was informed of the importance of frequent follow up visits to maximize her success with intensive lifestyle modifications for her multiple health conditions.   OBESITY BEHAVIORAL INTERVENTION VISIT  Today's visit was # 12 out of 22.  Starting weight: 232 lbs Starting date: 03/07/17 Today's weight : 206 lbs Today's date: 10/25/2017 Total lbs lost to date: 81 (Patients must lose 7 lbs in the first 6 months to continue with counseling)   ASK: We discussed the diagnosis of obesity with Jenna Chang today and Jenna Chang agreed to give [place] permission to discuss obesity behavioral modification therapy today.  ASSESS: Jenna Chang has the diagnosis of obesity and her BMI today is 36.5 Jenna Chang is  in the action stage of change   ADVISE: Jenna Chang was educated on the multiple health risks of obesity as well as the benefit of weight loss to improve her health. She was advised of the need for long term treatment and the importance of lifestyle modifications.  AGREE: Multiple dietary modification options and treatment options were discussed and  Jenna Chang agreed to the above obesity treatment plan.  I, Doreene Nest, am acting as  transcriptionist for Dennard Nip, MD  I have reviewed the above documentation for accuracy and completeness, and I agree with the above. -Dennard Nip, MD

## 2017-11-24 ENCOUNTER — Ambulatory Visit (INDEPENDENT_AMBULATORY_CARE_PROVIDER_SITE_OTHER): Payer: BC Managed Care – PPO | Admitting: Family Medicine

## 2017-11-24 VITALS — BP 143/82 | HR 80 | Temp 98.7°F | Ht 63.0 in | Wt 210.0 lb

## 2017-11-24 DIAGNOSIS — E559 Vitamin D deficiency, unspecified: Secondary | ICD-10-CM

## 2017-11-24 DIAGNOSIS — Z6837 Body mass index (BMI) 37.0-37.9, adult: Secondary | ICD-10-CM

## 2017-11-24 DIAGNOSIS — I272 Pulmonary hypertension, unspecified: Secondary | ICD-10-CM | POA: Insufficient documentation

## 2017-11-24 NOTE — Progress Notes (Signed)
Office: [phone]  /  Fax: [phone]   HPI:   Chief Complaint: OBESITY Ellyse is here to discuss her progress with her obesity treatment plan. She is on the Category 3 plan and is following her eating plan approximately 75 % of the time. She states she is walking and swimming 120 minutes 4 times per week. Mandy has been on vacation and has increased celebration eating, but much of what she gained is water weight. She is going on multiple, large trips and won't finish traveling for approximately 2 months. Her weight is 210 lb (95.3 kg) today and has had a weight gain of 4 pounds over a period of 4 weeks since her last visit. She has lost 22 lbs since starting treatment with [place].  Vitamin D deficiency Yasamin has a diagnosis of vitamin D deficiency. Her vitamin D level is now at the high end of normal, on OTC calcium with vitamin D 1,000 IU. Shakevia is now at risk of over replacement. She denies nausea, vomiting or muscle weakness.  ALLERGIES: Allergies  Allergen Reactions  . Trovafloxacin Anaphylaxis, Itching and Swelling    IV dosing only Tolerates levaquin without problems.  . Cefdinir Nausea And Vomiting  . Amlodipine Swelling  . Amlodipine Besylate Swelling  . Hydrochlorothiazide   . Losartan Cough  . Lisinopril Cough    MEDICATIONS: Current Outpatient Medications on File Prior to Visit  Medication Sig Dispense Refill  . albuterol (PROVENTIL HFA;VENTOLIN HFA) 108 (90 Base) MCG/ACT inhaler Inhale into the lungs every 6 (six) hours as needed for wheezing or shortness of breath.    . calcium-vitamin D 250-100 MG-UNIT tablet Take 1 tablet by mouth 2 (two) times daily.    . carvedilol (COREG) 6.25 MG tablet Take 12.5 mg 2 (two) times daily with a meal by mouth.     . cyclobenzaprine (FLEXERIL) 5 MG tablet Take 5 mg by mouth 3 (three) times daily as needed for muscle spasms.    Marland Kitchen desmopressin (DDAVP) 0.2 MG tablet Take 0.2 mg by mouth daily.    . furosemide (LASIX) 20 MG  tablet Take 20 mg by mouth daily as needed.     . Glycerin-Polysorbate 80 (REFRESH DRY EYE THERAPY OP) Apply 1 drop to eye 4 (four) times daily.    Marland Kitchen HYDROcodone-acetaminophen (NORCO) 7.5-325 MG tablet Take 1 tablet by mouth every 6 (six) hours as needed.     Marland Kitchen levocetirizine (XYZAL) 5 MG tablet Take 5 mg by mouth.    . magnesium 30 MG tablet Take 30 mg by mouth 2 (two) times daily.    . mirabegron ER (MYRBETRIQ) 50 MG TB24 tablet Take by mouth.    . Ospemifene (OSPHENA) 60 MG TABS Take 1 tablet by mouth daily.    . pantoprazole (PROTONIX) 40 MG tablet Take by mouth 2 (two) times daily.     . polycarbophil (FIBERCON) 625 MG tablet Take 1,250 mg by mouth daily.    . travoprost, benzalkonium, (TRAVATAN) 0.004 % ophthalmic solution Place 1 drop into both eyes at bedtime.    . valACYclovir (VALTREX) 1000 MG tablet take 1 tablet by mouth twice a day if needed    . verapamil (CALAN) 120 MG tablet Take 240 mg once by mouth.      No current facility-administered medications on file prior to visit.     PAST MEDICAL HISTORY: Past Medical History:  Diagnosis Date  . Acid reflux   . Anemia   . Anxiety   . Back pain   .  Bilateral swelling of feet   . Bursitis   . Cataract   . Chest pain   . Constipation   . Cough   . DDD (degenerative disc disease), cervical   . Depression   . Ear pain   . Gallbladder disease   . GERD (gastroesophageal reflux disease)   . Hiatal hernia   . HTN (hypertension)   . IBS (irritable bowel syndrome)   . IC (interstitial cystitis)   . Low sodium levels   . Multinodular goiter   . OA (osteoarthritis)   . Osteopenia   . Scoliosis   . Sleep apnea   . Spinal stenosis   . Vitamin D deficiency     PAST SURGICAL HISTORY: Past Surgical History:  Procedure Laterality Date  . APPENDECTOMY    . CESAREAN SECTION    . CHOLECYSTECTOMY    . COLOSTOMY    . COLOSTOMY CLOSURE    . COLOSTOMY REVERSAL    . DG SCOLIOSIS SERIES ([place] [facility])     rods placed  . HERNIA  REPAIR    . INTERSTIM IMPLANT PLACEMENT    . NASAL SEPTUM SURGERY    . PELVIC ABCESS DRAINAGE    . TONSILECTOMY, ADENOIDECTOMY, BILATERAL MYRINGOTOMY AND TUBES    . TYMPANOSTOMY TUBE PLACEMENT    . vascular leg      SOCIAL HISTORY: Social History   Tobacco Use  . Smoking status: Not on file  Substance Use Topics  . Alcohol use: Not on file  . Drug use: Not on file    FAMILY HISTORY: Family History  Problem Relation Age of Onset  . Hypertension Mother   . Heart disease Mother   . Stroke Mother   . Cancer Mother   . Anxiety disorder Mother   . Sleep apnea Mother   . Hyperlipidemia Father   . Hypertension Father   . Heart disease Father   . Thyroid disease Father   . Kidney disease Father   . Cancer Father     ROS: Review of Systems  Constitutional: Negative for weight loss.  Gastrointestinal: Negative for nausea and vomiting.  Musculoskeletal:       Negative for muscle weakness    PHYSICAL EXAM: Blood pressure (!) 143/82, pulse 80, temperature 98.7 F (37.1 C), temperature source Oral, height 5\' 3"  (1.6 m), weight 210 lb (95.3 kg), SpO2 97 %. Body mass index is 37.2 kg/m. Physical Exam  Constitutional: She is oriented to person, place, and time. She appears well-developed and well-nourished.  Cardiovascular: Normal rate.  Pulmonary/Chest: Effort normal.  Musculoskeletal: Normal range of motion.  Neurological: She is oriented to person, place, and time.  Skin: Skin is warm and dry.  Psychiatric: She has a normal mood and affect. Her behavior is normal.  Vitals reviewed.   RECENT LABS AND TESTS: BMET    Component Value Date/Time   NA 135 10/04/2017 0854   K 3.8 10/04/2017 0854   CL 98 10/04/2017 0854   CO2 23 10/04/2017 0854   GLUCOSE 82 10/04/2017 0854   BUN 18 10/04/2017 0854   CREATININE 0.59 10/04/2017 0854   CALCIUM 8.8 10/04/2017 0854   GFRNONAA 99 10/04/2017 0854   GFRAA 114 10/04/2017 0854   Lab Results  Component Value Date   HGBA1C  5.4 10/04/2017   HGBA1C 5.6 06/16/2017   HGBA1C 5.2 03/07/2017   Lab Results  Component Value Date   INSULIN 7.7 10/04/2017   INSULIN 7.6 06/16/2017   INSULIN 7.8 03/07/2017   CBC  Component Value Date/Time   WBC 7.1 10/04/2017 0854   RBC 4.23 10/04/2017 0854   HGB 11.5 10/04/2017 0854   HCT 36.6 10/04/2017 0854   MCV 87 10/04/2017 0854   MCH 27.2 10/04/2017 0854   MCHC 31.4 (L) 10/04/2017 0854   RDW 17.6 (H) 10/04/2017 0854   LYMPHSABS 2.4 10/04/2017 0854   EOSABS 0.2 10/04/2017 0854   BASOSABS 0.0 10/04/2017 0854   Iron/TIBC/Ferritin/ %Sat    Component Value Date/Time   IRON 73 03/07/2017 0930   TIBC 401 03/07/2017 0930   FERRITIN 17 03/07/2017 0930   IRONPCTSAT 18 03/07/2017 0930   Lipid Panel     Component Value Date/Time   CHOL 172 10/04/2017 0854   TRIG 89 10/04/2017 0854   HDL 63 10/04/2017 0854   LDLCALC 91 10/04/2017 0854   Hepatic Function Panel     Component Value Date/Time   PROT 5.6 (L) 10/04/2017 0854   ALBUMIN 3.4 (L) 10/04/2017 0854   AST 11 10/04/2017 0854   ALT 8 10/04/2017 0854   ALKPHOS 77 10/04/2017 0854   BILITOT <0.2 10/04/2017 0854      Component Value Date/Time   TSH 4.100 06/16/2017 0945   TSH 3.440 03/07/2017 0930   Results for CAMYA, HAYDON (MRN [id number]) as of 11/24/2017 15:04  Ref. Range 10/04/2017 08:54  Vitamin D, 25-Hydroxy Latest Ref Range: 30.0 - 100.0 ng/mL 88.6   ASSESSMENT AND PLAN: Vitamin D deficiency  Class 2 severe obesity with serious comorbidity and body mass index (BMI) of 37.0 to 37.9 in adult, unspecified obesity type ([place])  PLAN:  Vitamin D Deficiency Aivy was informed that low vitamin D levels contributes to fatigue and are associated with obesity, breast, and colon cancer. She agrees to discontinue OTC Vit D and we will recheck labs in 3 months. Kaytlynne will follow up for routine testing of vitamin D, at least 2-3 times per year. She was informed of the risk of over-replacement of vitamin D and  agrees to not increase her dose unless she discusses this with [place] first. Jiali agrees to follow up as directed.  We spent > than 50% of the 30 minute visit on the counseling as documented in the note.  Obesity Ursula is currently in the action stage of change. As such, her goal is to maintain weight while traveling. She has agreed to portion control better and make smarter food choices, such as increase vegetables and decrease simple carbohydrates  Yanitza has been instructed to work up to a goal of 150 minutes of combined cardio and strengthening exercise per week for weight loss and overall health benefits. We discussed the following Behavioral Modification Strategies today: increasing lean protein intake, increasing vegetables, dealing with family or coworker sabotage and travel eating strategies   Shauna has agreed to follow up with our clinic in 8 weeks. She was informed of the importance of frequent follow up visits to maximize her success with intensive lifestyle modifications for her multiple health conditions.   OBESITY BEHAVIORAL INTERVENTION VISIT  Today's visit was # 13 out of 22.  Starting weight: 232 lbs Starting date: 03/07/17 Today's weight : 210 lbs  Today's date: 11/24/2017 Total lbs lost to date: 22 (Patients must lose 7 lbs in the first 6 months to continue with counseling)   ASK: We discussed the diagnosis of obesity with [name] today and Antoine agreed to give [place] permission to discuss obesity behavioral modification therapy today.  ASSESS: Rafael has the diagnosis of obesity and her  BMI today is 37.21 Britta is in the action stage of change   ADVISE: Ludy was educated on the multiple health risks of obesity as well as the benefit of weight loss to improve her health. She was advised of the need for long term treatment and the importance of lifestyle modifications.  AGREE: Multiple dietary modification options and treatment options were discussed and   Vincy agreed to the above obesity treatment plan.  I, Doreene Nest, am acting as transcriptionist for Dennard Nip, MD  I have reviewed the above documentation for accuracy and completeness, and I agree with the above. -Dennard Nip, MD

## 2017-11-25 ENCOUNTER — Encounter: Payer: Self-pay | Admitting: Neurology

## 2017-11-25 ENCOUNTER — Ambulatory Visit: Payer: BC Managed Care – PPO | Admitting: Neurology

## 2017-11-25 VITALS — BP 167/83 | HR 56 | Ht 63.0 in | Wt 216.0 lb

## 2017-11-25 DIAGNOSIS — R51 Headache: Secondary | ICD-10-CM

## 2017-11-25 DIAGNOSIS — G43711 Chronic migraine without aura, intractable, with status migrainosus: Secondary | ICD-10-CM

## 2017-11-25 DIAGNOSIS — R519 Headache, unspecified: Secondary | ICD-10-CM

## 2017-11-25 DIAGNOSIS — H04123 Dry eye syndrome of bilateral lacrimal glands: Secondary | ICD-10-CM

## 2017-11-25 DIAGNOSIS — H539 Unspecified visual disturbance: Secondary | ICD-10-CM | POA: Diagnosis not present

## 2017-11-25 DIAGNOSIS — H269 Unspecified cataract: Secondary | ICD-10-CM | POA: Diagnosis not present

## 2017-11-25 DIAGNOSIS — G8929 Other chronic pain: Secondary | ICD-10-CM

## 2017-11-25 NOTE — Patient Instructions (Signed)
Onabotulinumtoxin injection (Medical Use) What is this medicine? ONABOTULINUMTOXINA (o na BOTT you lye num tox in eh) is a neuro-muscular blocker. This medicine is used to treat crossed eyes, eyelid spasms, severe neck muscle spasms, ankle and toe muscle spasms, and elbow, wrist, and finger muscle spasms. It is also used to treat excessive underarm sweating, to prevent chronic migraine headaches, and to treat loss of bladder control due to neurologic conditions such as multiple sclerosis or spinal cord injury. This medicine may be used for other purposes; ask your health care provider or pharmacist if you have questions. COMMON BRAND NAME(S): Botox What should I tell my health care provider before I take this medicine? They need to know if you have any of these conditions: -breathing problems -cerebral palsy spasms -difficulty urinating -heart problems -history of surgery where this medicine is going to be used -infection at the site where this medicine is going to be used -myasthenia gravis or other neurologic disease -nerve or muscle disease -surgery plans -take medicines that treat or prevent blood clots -thyroid problems -an unusual or allergic reaction to botulinum toxin, albumin, other medicines, foods, dyes, or preservatives -pregnant or trying to get pregnant -breast-feeding How should I use this medicine? This medicine is for injection into a muscle. It is given by a health care professional in a hospital or clinic setting. Talk to your pediatrician regarding the use of this medicine in children. While this drug may be prescribed for children as young as 29 years old for selected conditions, precautions do apply. Overdosage: If you think you have taken too much of this medicine contact a poison control center or emergency room at once. NOTE: This medicine is only for you. Do not share this medicine with others. What if I miss a dose? This does not apply. What may interact with  this medicine? -aminoglycoside antibiotics like gentamicin, neomycin, tobramycin -muscle relaxants -other botulinum toxin injections This list may not describe all possible interactions. Give your health care provider a list of all the medicines, herbs, non-prescription drugs, or dietary supplements you use. Also tell them if you smoke, drink alcohol, or use illegal drugs. Some items may interact with your medicine. What should I watch for while using this medicine? Visit your doctor for regular check ups. This medicine will cause weakness in the muscle where it is injected. Tell your doctor if you feel unusually weak in other muscles. Get medical help right away if you have problems with breathing, swallowing, or talking. This medicine might make your eyelids droop or make you see blurry or double. If you have weak muscles or trouble seeing do not drive a car, use machinery, or do other dangerous activities. This medicine contains albumin from human blood. It may be possible to pass an infection in this medicine, but no cases have been reported. Talk to your doctor about the risks and benefits of this medicine. If your activities have been limited by your condition, go back to your regular routine slowly after treatment with this medicine. What side effects may I notice from receiving this medicine? Side effects that you should report to your doctor or health care professional as soon as possible: -allergic reactions like skin rash, itching or hives, swelling of the face, lips, or tongue -breathing problems -changes in vision -chest pain or tightness -eye irritation, pain -fast, irregular heartbeat -infection -numbness -speech problems -swallowing problems -unusual weakness Side effects that usually do not require medical attention (report to your doctor or health care  professional if they continue or are bothersome): -bruising or pain at site where injected -drooping eyelid -dry eyes or  mouth -headache -muscles aches, pains -sensitivity to light -tearing This list may not describe all possible side effects. Call your doctor for medical advice about side effects. You may report side effects to FDA at 1-800-FDA-1088. Where should I keep my medicine? This drug is given in a hospital or clinic and will not be stored at home. NOTE: This sheet is a summary. It may not cover all possible information. If you have questions about this medicine, talk to your doctor, pharmacist, or health care provider.  2018 Elsevier/Gold Standard (2014-06-11 15:43:53)  Galcanezumab: Patient drug information Access [employer] here. Copyright [phone] [employer] All rights reserved. (For additional information see "Galcanezumab: Drug information")  Brand Names: [place] Emgality What is this drug used for? . It is used to prevent migraine headaches. . It is used to treat cluster headaches. What do I need to tell my doctor BEFORE I take this drug? . If you have an allergy to this drug or any part of this drug. . If you are allergic to any drugs like this one, any other drugs, foods, or other substances. Tell your doctor about the allergy and what signs you had, like rash; hives; itching; shortness of breath; wheezing; cough; swelling of face, lips, tongue, or throat; or any other signs. This drug may interact with other drugs or health problems. Tell your doctor and pharmacist about all of your drugs (prescription or OTC, natural products, vitamins) and health problems. You must check to make sure that it is safe for you to take this drug with all of your drugs and health problems. Do not start, stop, or change the dose of any drug without checking with your doctor. What are some things I need to know or do while I take this drug? . Tell all of your health care providers that you take this drug. This includes your doctors, nurses, pharmacists, and dentists. . Do not share pen or cartridge  devices with another person even if the needle has been changed. Sharing these devices may pass infections from one person to another. This includes infections you may not know you have. . Tell your doctor if you are pregnant, plan on getting pregnant, or are breast-feeding. You will need to talk about the benefits and risks to you and the baby. What are some side effects that I need to call my doctor about right away? WARNING/CAUTION: Even though it may be rare, some people may have very bad and sometimes deadly side effects when taking a drug. Tell your doctor or get medical help right away if you have any of the following signs or symptoms that may be related to a very bad side effect: . Signs of an allergic reaction, like rash; hives; itching; red, swollen, blistered, or peeling skin with or without fever; wheezing; tightness in the chest or throat; trouble breathing, swallowing, or talking; unusual hoarseness; or swelling of the mouth, face, lips, tongue, or throat. What are some other side effects of this drug? All drugs may cause side effects. However, many people have no side effects or only have minor side effects. Call your doctor or get medical help if any of these side effects or any other side effects bother you or do not go away: Marland Kitchen Irritation where the shot is given. These are not all of the side effects that may occur. If you have questions about  side effects, call your doctor. Call your doctor for medical advice about side effects. You may report side effects to your national health agency. How is this drug best taken? Use this drug as ordered by your doctor. Read all information given to you. Follow all instructions closely. . It is given as a shot into the fatty part of the skin in the upper arm, thigh, buttocks, or stomach area. . If you will be giving yourself the shot, your doctor or nurse will teach you how to give the shot. Wendee Copp your hands before use. . If stored in a  refrigerator, let this drug come to room temperature before using it. Leave it at room temperature for at least 30 minutes. Do not heat this drug. . Do not shake. . Do not give into skin that is irritated, bruised, red, infected, or scarred. . Do not give into skin within 2 inches of the belly button. . If your dose is more than 1 injection, you may give it in the same body part. Make sure you do not give injections in the same spot you used for the other injections. . Do not rub the site where you give the shot. . Do not use if the solution is cloudy, leaking, or has particles. . This drug is colorless to slightly yellow or slightly brown. Do not use if the solution changes color. . Move the site where you give the shot with each shot. . If you drop this drug on a hard surface, do not use it. . Each prefilled pen or syringe is for one use only. . Throw away needles in a needle/sharp disposal box. Do not reuse needles or other items. When the box is full, follow all local rules for getting rid of it. Talk with a doctor or pharmacist if you have any questions. What do I do if I miss a dose? Marland Kitchen Take a missed dose as soon as you think about it. . After taking a missed dose, start a new schedule based on when the dose is taken. How do I store and/or throw out this drug? . Store in a refrigerator. Do not freeze. . Store in the carton to protect from light. . If needed, you may store at room temperature for up to 7 days. Write down the date you take this drug out of the refrigerator. If stored at room temperature and not used within 7 days, throw this drug away. . Do not put this drug back in the refrigerator after it has been stored at room temperature. Marland Kitchen Keep all drugs in a safe place. Keep all drugs out of the reach of children and pets. . Throw away unused or expired drugs. Do not flush down a toilet or pour down a drain unless you are told to do so. Check with your pharmacist if you have questions  about the best way to throw out drugs. There may be drug take-back programs in your area. General drug facts . If your symptoms or health problems do not get better or if they become worse, call your doctor. . Do not share your drugs with others and do not take anyone else's drugs. Marland Kitchen Keep a list of all your drugs (prescription, natural products, vitamins, OTC) with you. Give this list to your doctor. . Talk with the doctor before starting any new drug, including prescription or OTC, natural products, or vitamins. . Some drugs may have another patient information leaflet. If you have any questions  about this drug, please talk with your doctor, nurse, pharmacist, or other health care provider. . If you think there has been an overdose, call your poison control center or get medical care right away. Be ready to tell or show what was taken, how much, and when it happened.

## 2017-11-25 NOTE — Progress Notes (Signed)
[facility]    Provider:  Dr [name] Referring Provider: Parke Poisson, MD Primary Care Physician:  Parke Poisson, MD  CC:  Chronic daily headache  HPI:  Jenna Chang is a 62 y.o. female here as a referral from Dr. Doyle Askew for chronic daily headaches.  Past medical history interstitial cystitis, pulmonary hypertension, reflux, asthma, urge incontinence, migraine, rhinitis, osteopenia, multinodular goiter, pharyngeal esophageal dysphasia, cataracts, at risk for glaucoma, brow ptosis, dry eye, hiatal hernia, tubes in ears twice, scoliosis, perianal abscess, fistula. Migraines started 30 years ago and was diagnosed with migraines. She has daily headaches, around the eyes, unilateral but can be either, She uses cpap every night, she started having daily headaches last September. No inciting events except in the setting of cpap. She stopped Trazodone. She still feels tired. But she says the headaches were before the daily headaches. Also vision changes. They are behind the eyes, can radiate into the nose, can be sharp, dull constant, continuous, can be pulsating and throbbing, light sound sensitivity, smells trigger, +nausea and vomiting. Daily headaches and 15 migrainous headache days, no medication overuse, no aura, ongoing at this frequency > 6 months, can be severe, wakes with headaches and can be positional.  15 migrainous. No medication overuse, not using OTC meds or hydrocodone more than 1-2x a week. More fatigued. Headache worsens as the day goes on.  NO aura. No other focal neurologic deficits, associated symptoms, inciting events or modifiable factors.  Reviewed notes, labs and imaging from outside physicians, which showed:   Reviewed referring physician notes.  She is here with her husband.  She has a history of migraines in September 2018 the headaches became daily, she is had issues with vision before that, is change glasses for different times, the headaches  remained daily.  Also obesity, placed on Wellbutrin which did help her cravings initially.  Headaches every day since September.  She has had a glasses replaced with lenses changes well 3 times.  Headaches behind eyes mainly, more so behind the left eye, notes some days worse than others, she does have a history of "cluster headaches", overmedicated by a prior provider for such, recently bumped heads with grandson as he was jumping into the pool 1 week ago, pain in left temple area getting better, also occasional swelling in the legs.  TSH normal  Medications tried: Flexeril, she is on Norco, verapamil, carvedilol, magnesium, wellbutrin, trazodone.   Review of Systems: Patient complains of symptoms per HPI as well as the following symptoms : Fatigue, blurred vision, loss of vision, eye pain, easy bruising, easy bleeding, headache, weakness, difficulty swallowing, sleep apnea "at times", anxiety decreased energy, snoring, swelling in legs, trouble swallowing, urination problems, runny nose, urge incontinence. Pertinent negatives and positives per HPI. All others negative.   Social History   Socioeconomic History  . Marital status: Married    Spouse name: Jenna Chang  . Number of children: 2  . Years of education: Not on file  . Highest education level: Not on file  Occupational History  . Occupation: Retired Tour manager  . Financial resource strain: Not on file  . Food insecurity:    Worry: Not on file    Inability: Not on file  . Transportation needs:    Medical: Not on file    Non-medical: Not on file  Tobacco Use  . Smoking status: Never Smoker  . Smokeless tobacco: Never Used  Substance and Sexual Activity  . Alcohol use: Yes  Comment: 1-2 drinks socially  . Drug use: Never  . Sexual activity: Not on file  Lifestyle  . Physical activity:    Days per week: Not on file    Minutes per session: Not on file  . Stress: Not on file  Relationships  . Social connections:     Talks on phone: Not on file    Gets together: Not on file    Attends religious service: Not on file    Active member of club or organization: Not on file    Attends meetings of clubs or organizations: Not on file    Relationship status: Not on file  . Intimate partner violence:    Fear of current or ex partner: Not on file    Emotionally abused: Not on file    Physically abused: Not on file    Forced sexual activity: Not on file  Other Topics Concern  . Not on file  Social History Narrative  . Not on file    Family History  Problem Relation Age of Onset  . Hypertension Mother   . Heart disease Mother   . Stroke Mother   . Cancer Mother   . Anxiety disorder Mother   . Sleep apnea Mother   . Hyperlipidemia Father   . Hypertension Father   . Heart disease Father   . Thyroid disease Father   . Kidney disease Father   . Cancer Father     Past Medical History:  Diagnosis Date  . Acid reflux   . Anemia   . Anxiety   . Back pain   . Bilateral swelling of feet   . Bursitis   . Cataract   . Chest pain   . Constipation   . Cough   . DDD (degenerative disc disease), cervical   . Depression   . Ear pain   . Gallbladder disease   . GERD (gastroesophageal reflux disease)   . Hiatal hernia   . HTN (hypertension)   . IBS (irritable bowel syndrome)   . IC (interstitial cystitis)   . Low sodium levels   . Multinodular goiter   . OA (osteoarthritis)   . Osteopenia   . Scoliosis   . Sleep apnea   . Spinal stenosis   . Vitamin D deficiency     Past Surgical History:  Procedure Laterality Date  . APPENDECTOMY    . CESAREAN SECTION    . CHOLECYSTECTOMY    . COLOSTOMY    . COLOSTOMY CLOSURE    . COLOSTOMY REVERSAL    . DG SCOLIOSIS SERIES ([place] HX)     rods placed  . HERNIA REPAIR    . INTERSTIM IMPLANT PLACEMENT    . NASAL SEPTUM SURGERY    . PELVIC ABCESS DRAINAGE    . TONSILECTOMY, ADENOIDECTOMY, BILATERAL MYRINGOTOMY AND TUBES    . TYMPANOSTOMY TUBE PLACEMENT     . vascular leg      Current Outpatient Medications  Medication Sig Dispense Refill  . albuterol (PROVENTIL HFA;VENTOLIN HFA) 108 (90 Base) MCG/ACT inhaler Inhale into the lungs every 6 (six) hours as needed for wheezing or shortness of breath.    . Ascorbic Acid (VITAMIN C) 1000 MG tablet Take 1,000 mg by mouth daily.    Marland Kitchen aspirin EC 81 MG tablet Take 81 mg by mouth daily.    . carvedilol (COREG) 6.25 MG tablet Take 12.5 mg 2 (two) times daily with a meal by mouth.     . cyclobenzaprine (FLEXERIL)  5 MG tablet Take 5 mg by mouth 3 (three) times daily as needed for muscle spasms.    Marland Kitchen desmopressin (DDAVP) 0.2 MG tablet Take 0.2 mg by mouth daily.    . furosemide (LASIX) 20 MG tablet Take 20 mg by mouth daily as needed.     . Glycerin-Polysorbate 80 (REFRESH DRY EYE THERAPY OP) Apply 1 drop to eye 4 (four) times daily.    Marland Kitchen HYDROcodone-acetaminophen (NORCO) 7.5-325 MG tablet Take 1 tablet by mouth every 6 (six) hours as needed.     Marland Kitchen levocetirizine (XYZAL) 5 MG tablet Take 5 mg by mouth.    . magnesium 30 MG tablet Take 30 mg by mouth 2 (two) times daily.    . mirabegron ER (MYRBETRIQ) 50 MG TB24 tablet Take by mouth.    . Ospemifene (OSPHENA) 60 MG TABS Take 1 tablet by mouth daily.    . pantoprazole (PROTONIX) 40 MG tablet Take by mouth 2 (two) times daily.     . polycarbophil (FIBERCON) 625 MG tablet Take 1,250 mg by mouth daily.    . travoprost, benzalkonium, (TRAVATAN) 0.004 % ophthalmic solution Place 1 drop into both eyes at bedtime.    . valACYclovir (VALTREX) 1000 MG tablet take 1 tablet by mouth twice a day if needed    . verapamil (CALAN) 120 MG tablet Take 240 mg once by mouth.     . topiramate (TOPAMAX) 50 MG tablet Take 1 tablet (50 mg total) by mouth 2 (two) times daily. 30 tablet 11   No current facility-administered medications for this visit.     Allergies as of 11/25/2017 - Review Complete 11/25/2017  Allergen Reaction Noted  . Trovafloxacin Anaphylaxis, Itching, and  Swelling 07/05/2013  . Cefdinir Nausea And Vomiting 08/07/2012  . Amlodipine Swelling 11/07/2012  . Amlodipine besylate Swelling 11/26/2010  . Hydrochlorothiazide  03/07/2017  . Losartan Cough 03/07/2017  . Lisinopril Cough 10/24/2013    Vitals: BP (!) 167/83   Pulse (!) 56   Ht 5\' 3"  (1.6 m)   Wt 216 lb (98 kg)   LMP  (LMP Unknown)   BMI 38.26 kg/m  Last Weight:  Wt Readings from Last 1 Encounters:  11/25/17 216 lb (98 kg)   Last Height:   Ht Readings from Last 1 Encounters:  11/25/17 5\' 3"  (1.6 m)   Physical exam: Exam: Gen: NAD, conversant, well nourised, obese, well groomed                     CV: RRR, no MRG. No Carotid Bruits. No peripheral edema, warm, nontender Eyes: Conjunctivae clear without exudates or hemorrhage  Neuro: Detailed Neurologic Exam  Speech:    Speech is normal; fluent and spontaneous with normal comprehension.  Cognition:    The patient is oriented to person, place, and time;     recent and remote memory intact;     language fluent;     normal attention, concentration,     fund of knowledge Cranial Nerves:    The pupils are equal, round, and reactive to light. The fundi are normal and spontaneous venous pulsations are present. Visual fields are full to finger confrontation. Extraocular movements are intact. Trigeminal sensation is intact and the muscles of mastication are normal. The face is symmetric. The palate elevates in the midline. Hearing intact. Voice is normal. Shoulder shrug is normal. The tongue has normal motion without fasciculations.   Coordination:    Normal finger to nose and heel to shin. Normal  rapid alternating movements.   Gait:    Heel-toe and tandem gait are normal.   Motor Observation:    No asymmetry, no atrophy, and no involuntary movements noted. Tone:    Normal muscle tone.    Posture:    Posture is normal. normal erect    Strength:    Strength is V/V in the upper and lower limbs.      Sensation: intact  to LT     Reflex Exam:  DTR's:    Deep tendon reflexes in the upper and lower extremities are normal bilaterally.   Toes:    The toes are downgoing bilaterally.   Clonus:    Clonus is absent.       Assessment/Plan:  62 year old with daily intractable migraines. May be migraines but needs thorough evaluation  CT scan if of the head with contrast:  due to concerning symptoms of morning headaches, positional headaches,vision changes of diplopia  to look for space occupying mass, chiari or intracranial hypertension (pseudotumor).  Dr Katy Fitch: eye pain and dry eyes, glaucoma,  vision changes would like a new ophthalmologist for another opinion  Botox for migraine initiation, failed multiple classes of medications  Medications tried: Flexeril, verapamil, carvedilol, magnesium, wellbutrin, trazodone, Topiramate, celexa  Orders Placed This Encounter  Procedures  . CT HEAD W & WO CONTRAST  . Ambulatory referral to Ophthalmology   Discussed: To prevent or relieve headaches, try the following: Cool Compress. Lie down and place a cool compress on your head.  Avoid headache triggers. If certain foods or odors seem to have triggered your migraines in the past, avoid them. A headache diary might help you identify triggers.  Include physical activity in your daily routine. Try a daily walk or other moderate aerobic exercise.  Manage stress. Find healthy ways to cope with the stressors, such as delegating tasks on your to-do list.  Practice relaxation techniques. Try deep breathing, yoga, massage and visualization.  Eat regularly. Eating regularly scheduled meals and maintaining a healthy diet might help prevent headaches. Also, drink plenty of fluids.  Follow a regular sleep schedule. Sleep deprivation might contribute to headaches Consider biofeedback. With this mind-body technique, you learn to control certain bodily functions - such as muscle tension, heart rate and blood pressure - to prevent  headaches or reduce headache pain.    Proceed to emergency room if you experience new or worsening symptoms or symptoms do not resolve, if you have new neurologic symptoms or if headache is severe, or for any concerning symptom.   Provided education and documentation from American headache Society toolbox including articles on: chronic migraine medication overuse headache, chronic migraines, prevention of migraines, behavioral and other nonpharmacologic treatments for headache.   Cc: Dr. Staci Acosta, MD  [facility] [address] [place], [place] [address]  Phone [phone] Fax [phone]

## 2017-11-28 ENCOUNTER — Telehealth: Payer: Self-pay | Admitting: Neurology

## 2017-11-28 DIAGNOSIS — G43711 Chronic migraine without aura, intractable, with status migrainosus: Secondary | ICD-10-CM | POA: Insufficient documentation

## 2017-11-28 NOTE — Telephone Encounter (Signed)
Did you get the message about this new botox patient?

## 2017-11-28 NOTE — Telephone Encounter (Addendum)
Yes, and she is on the schedule. Authorizations and enrollments have been started.

## 2017-11-28 NOTE — Telephone Encounter (Signed)
error 

## 2017-11-29 ENCOUNTER — Telehealth: Payer: Self-pay | Admitting: Neurology

## 2017-11-29 NOTE — Telephone Encounter (Signed)
[employer] Auth: [id number] (exp. 11/29/17 to 12/28/17) order sent to [facility]. They will reach out to the pt to schedule.

## 2017-12-14 NOTE — Telephone Encounter (Signed)
I called to check the status of the medication and they stated that the insurance had not been put in yet. I gave them the information again.

## 2017-12-16 NOTE — Telephone Encounter (Signed)
[employer] authorizations  [id number] [phone] (05/28/18)

## 2017-12-19 NOTE — Telephone Encounter (Signed)
I called [facility] and spoke with Lorriane Shire who stated that the patient has been verified but they are waiting to speak with the patient.   I called the patient to make her aware but it went straight to VM and her mail box was full so I could not leave a VM.

## 2017-12-20 ENCOUNTER — Telehealth: Payer: Self-pay | Admitting: Neurology

## 2017-12-20 NOTE — Telephone Encounter (Signed)
I called the patients [employer] (listed on DPR) he did not answer so I left a VM asking for a return call.

## 2017-12-20 NOTE — Telephone Encounter (Signed)
Can you try to call the people on her DPR and see if we can reach her? Otherwise I can use samples

## 2017-12-20 NOTE — Telephone Encounter (Signed)
I called the patient to let her know the pharmacy needs consent but it went straight to VM and her VM is full.

## 2017-12-21 NOTE — Telephone Encounter (Signed)
Patient called and I spoke to her regarding the pharmacy. I gave her the phone number to the pharmacy and asked her to call this week. She requested we move her apt until September because she is going on several trips and does not want to have the injections until she gets back.

## 2017-12-21 NOTE — Telephone Encounter (Signed)
Pt returned Danielle's call. Please call her asap!

## 2017-12-21 NOTE — Telephone Encounter (Signed)
[id number] (06/23/18).

## 2017-12-21 NOTE — Telephone Encounter (Signed)
I have called her husband and sent a mychart message with no response, I just wanted to give you a heads up because it looks like you may need your samples for tomorrow.

## 2017-12-21 NOTE — Telephone Encounter (Signed)
I called patient, she requested to move her apt to a later time due to sinus injection.

## 2017-12-22 ENCOUNTER — Ambulatory Visit: Payer: BC Managed Care – PPO | Admitting: Neurology

## 2018-01-17 ENCOUNTER — Ambulatory Visit (INDEPENDENT_AMBULATORY_CARE_PROVIDER_SITE_OTHER): Payer: BC Managed Care – PPO | Admitting: Family Medicine

## 2018-01-17 VITALS — BP 132/83 | HR 64 | Temp 98.3°F | Ht 63.0 in | Wt 217.0 lb

## 2018-01-17 DIAGNOSIS — Z6838 Body mass index (BMI) 38.0-38.9, adult: Secondary | ICD-10-CM | POA: Diagnosis not present

## 2018-01-17 DIAGNOSIS — E8881 Metabolic syndrome: Secondary | ICD-10-CM

## 2018-01-17 DIAGNOSIS — E559 Vitamin D deficiency, unspecified: Secondary | ICD-10-CM | POA: Diagnosis not present

## 2018-01-17 NOTE — Progress Notes (Signed)
Office: [phone]  /  Fax: [phone]   HPI:   Chief Complaint: OBESITY Jenna Chang is here to discuss her progress with her obesity treatment plan. She is on the portion control better and make smarter food choices, such as increase vegetables and decrease simple carbohydrates and is following her eating plan approximately 0 % of the time. She states she was doing a lot of walking while on a cruise. Jenna Chang was on a cruise and increased celebration eating. She is ready to get back to a structured meal plan.  Her weight is 217 lb (98.4 kg) today and has gained 7 pounds since her last visit. She has lost 15 lbs since starting treatment with [place].  Insulin Resistance Jenna Chang has a diagnosis of insulin resistance based on her elevated fasting insulin level >5. Although Jenna Chang blood glucose readings are still under good control, insulin resistance puts her at greater risk of metabolic syndrome and diabetes. She is not taking metformin currently and she is attempting to diet control but is gaining weight; and she is starting to increase simple carbohydrates.  Vitamin D Deficiency Jenna Chang has a diagnosis of vitamin D deficiency. She is stable on OTC Vit D and she is due for labs. She denies nausea, vomiting or muscle weakness.  ALLERGIES: Allergies  Allergen Reactions  . Trovafloxacin Anaphylaxis, Itching and Swelling    IV dosing only Tolerates levaquin without problems.  . Cefdinir Nausea And Vomiting  . Amlodipine Swelling  . Amlodipine Besylate Swelling  . Hydrochlorothiazide   . Losartan Cough  . Lisinopril Cough    MEDICATIONS: Current Outpatient Medications on File Prior to Visit  Medication Sig Dispense Refill  . albuterol (PROVENTIL HFA;VENTOLIN HFA) 108 (90 Base) MCG/ACT inhaler Inhale into the lungs every 6 (six) hours as needed for wheezing or shortness of breath.    . Ascorbic Acid (VITAMIN C) 1000 MG tablet Take 1,000 mg by mouth daily.    Marland Kitchen aspirin EC 81 MG tablet Take 81  mg by mouth daily.    . carvedilol (COREG) 6.25 MG tablet Take 12.5 mg 2 (two) times daily with a meal by mouth.     . cyclobenzaprine (FLEXERIL) 5 MG tablet Take 5 mg by mouth 3 (three) times daily as needed for muscle spasms.    Marland Kitchen desmopressin (DDAVP) 0.2 MG tablet Take 0.2 mg by mouth daily.    . furosemide (LASIX) 20 MG tablet Take 20 mg by mouth daily as needed.     . Glycerin-Polysorbate 80 (REFRESH DRY EYE THERAPY OP) Apply 1 drop to eye 4 (four) times daily.    Marland Kitchen HYDROcodone-acetaminophen (NORCO) 7.5-325 MG tablet Take 1 tablet by mouth every 6 (six) hours as needed.     Marland Kitchen levocetirizine (XYZAL) 5 MG tablet Take 5 mg by mouth.    . magnesium 30 MG tablet Take 30 mg by mouth 2 (two) times daily.    . mirabegron ER (MYRBETRIQ) 50 MG TB24 tablet Take by mouth.    . Ospemifene (OSPHENA) 60 MG TABS Take 1 tablet by mouth daily.    . pantoprazole (PROTONIX) 40 MG tablet Take by mouth 2 (two) times daily.     . polycarbophil (FIBERCON) 625 MG tablet Take 1,250 mg by mouth daily.    . travoprost, benzalkonium, (TRAVATAN) 0.004 % ophthalmic solution Place 1 drop into both eyes at bedtime.    . valACYclovir (VALTREX) 1000 MG tablet take 1 tablet by mouth twice a day if needed    . verapamil (CALAN)  120 MG tablet Take 240 mg once by mouth.      No current facility-administered medications on file prior to visit.     PAST MEDICAL HISTORY: Past Medical History:  Diagnosis Date  . Acid reflux   . Anemia   . Anxiety   . Back pain   . Bilateral swelling of feet   . Bursitis   . Cataract   . Chest pain   . Constipation   . Cough   . DDD (degenerative disc disease), cervical   . Depression   . Ear pain   . Gallbladder disease   . GERD (gastroesophageal reflux disease)   . Hiatal hernia   . HTN (hypertension)   . IBS (irritable bowel syndrome)   . IC (interstitial cystitis)   . Low sodium levels   . Multinodular goiter   . OA (osteoarthritis)   . Osteopenia   . Scoliosis   . Sleep  apnea   . Spinal stenosis   . Vitamin D deficiency     PAST SURGICAL HISTORY: Past Surgical History:  Procedure Laterality Date  . APPENDECTOMY    . CESAREAN SECTION    . CHOLECYSTECTOMY    . COLOSTOMY    . COLOSTOMY CLOSURE    . COLOSTOMY REVERSAL    . DG SCOLIOSIS SERIES ([facility])     rods placed  . HERNIA REPAIR    . INTERSTIM IMPLANT PLACEMENT    . NASAL SEPTUM SURGERY    . PELVIC ABCESS DRAINAGE    . TONSILECTOMY, ADENOIDECTOMY, BILATERAL MYRINGOTOMY AND TUBES    . TYMPANOSTOMY TUBE PLACEMENT    . vascular leg      SOCIAL HISTORY: Social History   Tobacco Use  . Smoking status: Never Smoker  . Smokeless tobacco: Never Used  Substance Use Topics  . Alcohol use: Yes    Comment: 1-2 drinks socially  . Drug use: Never    FAMILY HISTORY: Family History  Problem Relation Age of Onset  . Hypertension Mother   . Heart disease Mother   . Stroke Mother   . Cancer Mother   . Anxiety disorder Mother   . Sleep apnea Mother   . Hyperlipidemia Father   . Hypertension Father   . Heart disease Father   . Thyroid disease Father   . Kidney disease Father   . Cancer Father     ROS: Review of Systems  Constitutional: Negative for weight loss.  Gastrointestinal: Negative for nausea and vomiting.  Musculoskeletal:       Negative muscle weakness  Endo/Heme/Allergies:       Positive polyphagia    PHYSICAL EXAM: Blood pressure 132/83, pulse 64, temperature 98.3 F (36.8 C), temperature source Oral, height 5\' 3"  (1.6 m), weight 217 lb (98.4 kg), SpO2 99 %. Body mass index is 38.44 kg/m. Physical Exam  Constitutional: She is oriented to person, place, and time. She appears well-developed and well-nourished.  Cardiovascular: Normal rate.  Pulmonary/Chest: Effort normal.  Musculoskeletal: Normal range of motion.  Neurological: She is oriented to person, place, and time.  Skin: Skin is warm and dry.  Psychiatric: She has a normal mood and affect. Her behavior is  normal.  Vitals reviewed.   RECENT LABS AND TESTS: BMET    Component Value Date/Time   NA 135 10/04/2017 0854   K 3.8 10/04/2017 0854   CL 98 10/04/2017 0854   CO2 23 10/04/2017 0854   GLUCOSE 82 10/04/2017 0854   BUN 18 10/04/2017 0854  CREATININE 0.59 10/04/2017 0854   CALCIUM 8.8 10/04/2017 0854   GFRNONAA 99 10/04/2017 0854   GFRAA 114 10/04/2017 0854   Lab Results  Component Value Date   HGBA1C 5.4 10/04/2017   HGBA1C 5.6 06/16/2017   HGBA1C 5.2 03/07/2017   Lab Results  Component Value Date   INSULIN 7.7 10/04/2017   INSULIN 7.6 06/16/2017   INSULIN 7.8 03/07/2017   CBC    Component Value Date/Time   WBC 7.1 10/04/2017 0854   RBC 4.23 10/04/2017 0854   HGB 11.5 10/04/2017 0854   HCT 36.6 10/04/2017 0854   MCV 87 10/04/2017 0854   MCH 27.2 10/04/2017 0854   MCHC 31.4 (L) 10/04/2017 0854   RDW 17.6 (H) 10/04/2017 0854   LYMPHSABS 2.4 10/04/2017 0854   EOSABS 0.2 10/04/2017 0854   BASOSABS 0.0 10/04/2017 0854   Iron/TIBC/Ferritin/ %Sat    Component Value Date/Time   IRON 73 03/07/2017 0930   TIBC 401 03/07/2017 0930   FERRITIN 17 03/07/2017 0930   IRONPCTSAT 18 03/07/2017 0930   Lipid Panel     Component Value Date/Time   CHOL 172 10/04/2017 0854   TRIG 89 10/04/2017 0854   HDL 63 10/04/2017 0854   LDLCALC 91 10/04/2017 0854   Hepatic Function Panel     Component Value Date/Time   PROT 5.6 (L) 10/04/2017 0854   ALBUMIN 3.4 (L) 10/04/2017 0854   AST 11 10/04/2017 0854   ALT 8 10/04/2017 0854   ALKPHOS 77 10/04/2017 0854   BILITOT <0.2 10/04/2017 0854      Component Value Date/Time   TSH 4.100 06/16/2017 0945   TSH 3.440 03/07/2017 0930  Results for ARTIA, SINGLEY (MRN [id number]) as of 01/17/2018 12:24  Ref. Range 10/04/2017 08:54  Vitamin D, 25-Hydroxy Latest Ref Range: 30.0 - 100.0 ng/mL 88.6    ASSESSMENT AND PLAN: Insulin resistance - Plan: Hemoglobin A1c, Insulin, random  Vitamin D deficiency - Plan: VITAMIN D 25 Hydroxy (Vit-D  Deficiency, Fractures)  Class 2 severe obesity with serious comorbidity and body mass index (BMI) of 38.0 to 38.9 in adult, unspecified obesity type (Jenna Chang)  PLAN:  Insulin Resistance Jenna Chang will get back to diet, and will continue to work on weight loss, exercise, and decreasing simple carbohydrates in her diet to help decrease the risk of diabetes. We dicussed metformin including benefits and risks. She was informed that eating too many simple carbohydrates or too many calories at one sitting increases the likelihood of GI side effects. Jenna Chang declined metformin for now and prescription was not written today. We will check labs and Jenna Chang agrees to follow up with our clinic in 3 to 4 weeks as directed to monitor her progress.  Vitamin D Deficiency Jenna Chang was informed that low vitamin D levels contributes to fatigue and are associated with obesity, breast, and colon cancer. She will follow up for routine testing of vitamin D, at least 2-3 times per year. She was informed of the risk of over-replacement of vitamin D and agrees to not increase her dose unless she discusses this with [place] first. We will check labs and Jenna Chang agrees to follow up with our clinic in 3 to 4 weeks.  Obesity Jenna Chang is currently in the action stage of change. As such, her goal is to continue with weight loss efforts She has agreed to change to follow a lower carbohydrate, vegetable and lean protein rich diet plan Jenna Chang has been instructed to work up to a goal of 150 minutes of combined cardio  and strengthening exercise per week for weight loss and overall health benefits. We discussed the following Behavioral Modification Strategies today: increasing lean protein intake and work on meal planning and easy cooking plans   Jenna Chang has agreed to follow up with our clinic in 3 to 4 weeks. She was informed of the importance of frequent follow up visits to maximize her success with intensive lifestyle modifications for her multiple  health conditions.   OBESITY BEHAVIORAL INTERVENTION VISIT  Today's visit was # 14   Starting weight: 232 lbs Starting date: 03/07/17 Today's weight : 217 lbs  Today's date: 01/17/2018 Total lbs lost to date: 15 At least 15 minutes were spent on discussing the following behavioral intervention visit.   ASK: We discussed the diagnosis of obesity with Jenna Chang today and Jenna Chang agreed to give [place] permission to discuss obesity behavioral modification therapy today.  ASSESS: Jenna Chang has the diagnosis of obesity and her BMI today is 38.45 Jenna Chang is in the action stage of change   ADVISE: Jenna Chang was educated on the multiple health risks of obesity as well as the benefit of weight loss to improve her health. She was advised of the need for long term treatment and the importance of lifestyle modifications to improve her current health and to decrease her risk of future health problems.  AGREE: Multiple dietary modification options and treatment options were discussed and  Jenna Chang agreed to follow the recommendations documented in the above note.  ARRANGE: Jenna Chang was educated on the importance of frequent visits to treat obesity as outlined per CMS and USPSTF guidelines and agreed to schedule her next follow up appointment today.  I, Trixie Dredge, am acting as transcriptionist for Dennard Nip, MD  I have reviewed the above documentation for accuracy and completeness, and I agree with the above. -Dennard Nip, MD

## 2018-01-18 LAB — HEMOGLOBIN A1C
Est. average glucose Bld gHb Est-mCnc: 105 mg/dL
Hgb A1c MFr Bld: 5.3 % (ref 4.8–5.6)

## 2018-01-18 LAB — INSULIN, RANDOM: INSULIN: 4.4 u[IU]/mL (ref 2.6–24.9)

## 2018-01-18 LAB — VITAMIN D 25 HYDROXY (VIT D DEFICIENCY, FRACTURES): VIT D 25 HYDROXY: 55.8 ng/mL (ref 30.0–100.0)

## 2018-01-19 NOTE — Telephone Encounter (Signed)
Pending patient consent. DW

## 2018-01-19 NOTE — Telephone Encounter (Signed)
Noted, thank you

## 2018-01-19 NOTE — Telephone Encounter (Signed)
I called the patient to make her aware but she did not answer so I left a VM asking her to call me back. If she called please tell her to call [phone].

## 2018-01-19 NOTE — Telephone Encounter (Signed)
Pt called pharmacy and was advised Danielle needs to call to speed up delivery

## 2018-01-20 NOTE — Telephone Encounter (Signed)
I called and scheduled a morning delivery for 01/24/18. DW

## 2018-01-23 ENCOUNTER — Ambulatory Visit
Admission: RE | Admit: 2018-01-23 | Discharge: 2018-01-23 | Disposition: A | Discharge status: Home / Self Care | Payer: BC Managed Care – PPO | Source: Ambulatory Visit | Attending: Neurology | Admitting: Neurology

## 2018-01-23 DIAGNOSIS — G8929 Other chronic pain: Secondary | ICD-10-CM

## 2018-01-23 DIAGNOSIS — R51 Headache: Secondary | ICD-10-CM

## 2018-01-23 DIAGNOSIS — R519 Headache, unspecified: Secondary | ICD-10-CM

## 2018-01-23 MED ORDER — IOPAMIDOL (ISOVUE-300) INJECTION 61%
75.0000 mL | Freq: Once | INTRAVENOUS | Status: AC | PRN
  Administered 2018-01-23: 75 mL via INTRAVENOUS

## 2018-01-24 ENCOUNTER — Ambulatory Visit (INDEPENDENT_AMBULATORY_CARE_PROVIDER_SITE_OTHER): Payer: BC Managed Care – PPO | Admitting: Neurology

## 2018-01-24 DIAGNOSIS — G43711 Chronic migraine without aura, intractable, with status migrainosus: Secondary | ICD-10-CM

## 2018-01-24 MED ORDER — ERENUMAB-AOOE 140 MG/ML ~~LOC~~ SOAJ: 140.0000 mg | SUBCUTANEOUS | 11 refills | Status: DC

## 2018-01-24 NOTE — Progress Notes (Signed)

## 2018-01-24 NOTE — Telephone Encounter (Signed)
I spoke with the patient today regarding copay assistance and gave her a folder.

## 2018-01-24 NOTE — Progress Notes (Signed)
Botox- 100 units x 2 vials Lot: [id number] Expiration: 07/2020 NDC: 2429-9806-99  Bacteriostatic 0.9% Sodium Chloride- 31mL total Lot: [id number] Expiration: 02/15/2019 NDC: 7375-0510-71  Dx: G52.479 SP

## 2018-01-26 ENCOUNTER — Telehealth: Payer: Self-pay | Admitting: *Deleted

## 2018-01-26 NOTE — Telephone Encounter (Signed)
Completed Aimovig 140 mg PA on Cover My Meds. Key: [id number]. Awaiting [employer] determination within 24 hours.

## 2018-01-26 NOTE — Telephone Encounter (Signed)
Aimovig has been denied by [employer]. Aimovig can be covered only when pt cannot be switched to Ajovy or Emgality.   For customer care questions, contact [phone]. You may request an appeal by sending a written request to the address below. To be eligible for an appeal, your request must be in writing and received within 180 days of the date of this letter. Please mail or fax your appeal to:  [facility] / [place], [place] [address] Fax: [phone]

## 2018-02-07 ENCOUNTER — Ambulatory Visit (INDEPENDENT_AMBULATORY_CARE_PROVIDER_SITE_OTHER): Payer: BC Managed Care – PPO | Admitting: Family Medicine

## 2018-02-07 VITALS — BP 122/82 | HR 69 | Temp 98.0°F | Ht 63.0 in | Wt 211.0 lb

## 2018-02-07 DIAGNOSIS — I10 Essential (primary) hypertension: Secondary | ICD-10-CM

## 2018-02-07 DIAGNOSIS — Z6837 Body mass index (BMI) 37.0-37.9, adult: Secondary | ICD-10-CM | POA: Diagnosis not present

## 2018-02-08 NOTE — Progress Notes (Signed)
Office: [phone]  /  Fax: [phone]   HPI:   Chief Complaint: OBESITY [name] is here to discuss her progress with her obesity treatment plan. She is on the  follow a lower carbohydrate, vegetable and lean protein rich diet plan and is following her eating plan approximately 75 % of the time. She states she did more walking on her trip.  Jenna Chang is doing well with weight loss on low carbohydrate even with her increased traveling and eating out. She is mindful about her food choices.  Her weight is 211 lb (95.7 kg) today and has had a weight loss of 6 pounds over a period of 3 weeks since her last visit. She has lost 21 lbs since starting treatment with [place].  Hypertension [name] is a 62 y.o. female with hypertension.  [name] denies chest pain, dizziness, or shortness of breath on exertion. She is working weight loss to help control her blood pressure with the goal of decreasing her risk of heart attack and stroke. Cheryls blood pressure is currently controlled.   ALLERGIES: Allergies  Allergen Reactions  . Trovafloxacin Anaphylaxis, Itching and Swelling    IV dosing only Tolerates levaquin without problems.  . Cefdinir Nausea And Vomiting  . Amlodipine Swelling  . Amlodipine Besylate Swelling  . Hydrochlorothiazide   . Losartan Cough  . Lisinopril Cough    MEDICATIONS: Current Outpatient Medications on File Prior to Visit  Medication Sig Dispense Refill  . albuterol (PROVENTIL HFA;VENTOLIN HFA) 108 (90 Base) MCG/ACT inhaler Inhale into the lungs every 6 (six) hours as needed for wheezing or shortness of breath.    . Ascorbic Acid (VITAMIN C) 1000 MG tablet Take 1,000 mg by mouth daily.    Marland Kitchen aspirin EC 81 MG tablet Take 81 mg by mouth daily.    . carvedilol (COREG) 6.25 MG tablet Take 12.5 mg 2 (two) times daily with a meal by mouth.     . cyclobenzaprine (FLEXERIL) 5 MG tablet Take 5 mg by mouth 3 (three) times daily as needed for muscle spasms.    Marland Kitchen  desmopressin (DDAVP) 0.2 MG tablet Take 0.2 mg by mouth daily.    [name] (AIMOVIG) 140 MG/ML SOAJ Inject 140 mg into the skin every 30 (thirty) days. 1 pen 11  . furosemide (LASIX) 20 MG tablet Take 20 mg by mouth daily as needed.     . Glycerin-Polysorbate 80 (REFRESH DRY EYE THERAPY OP) Apply 1 drop to eye 4 (four) times daily.    Marland Kitchen HYDROcodone-acetaminophen (NORCO) 7.5-325 MG tablet Take 1 tablet by mouth every 6 (six) hours as needed.     Marland Kitchen levocetirizine (XYZAL) 5 MG tablet Take 5 mg by mouth.    . mirabegron ER (MYRBETRIQ) 50 MG TB24 tablet Take by mouth.    . Ospemifene (OSPHENA) 60 MG TABS Take 1 tablet by mouth daily.    . pantoprazole (PROTONIX) 40 MG tablet Take by mouth 2 (two) times daily.     . polycarbophil (FIBERCON) 625 MG tablet Take 1,250 mg by mouth daily.    . travoprost, benzalkonium, (TRAVATAN) 0.004 % ophthalmic solution Place 1 drop into both eyes at bedtime.    . valACYclovir (VALTREX) 1000 MG tablet take 1 tablet by mouth twice a day if needed    . verapamil (CALAN) 120 MG tablet Take 240 mg once by mouth.      No current facility-administered medications on file prior to visit.     PAST MEDICAL HISTORY: Past Medical  History:  Diagnosis Date  . Acid reflux   . Anemia   . Anxiety   . Back pain   . Bilateral swelling of feet   . Bursitis   . Cataract   . Chest pain   . Constipation   . Cough   . DDD (degenerative disc disease), cervical   . Depression   . Ear pain   . Gallbladder disease   . GERD (gastroesophageal reflux disease)   . Hiatal hernia   . HTN (hypertension)   . IBS (irritable bowel syndrome)   . IC (interstitial cystitis)   . Low sodium levels   . Multinodular goiter   . OA (osteoarthritis)   . Osteopenia   . Scoliosis   . Sleep apnea   . Spinal stenosis   . Vitamin D deficiency     PAST SURGICAL HISTORY: Past Surgical History:  Procedure Laterality Date  . APPENDECTOMY    . CESAREAN SECTION    . CHOLECYSTECTOMY      . COLOSTOMY    . COLOSTOMY CLOSURE    . COLOSTOMY REVERSAL    . DG SCOLIOSIS SERIES ([facility])     rods placed  . HERNIA REPAIR    . INTERSTIM IMPLANT PLACEMENT    . NASAL SEPTUM SURGERY    . PELVIC ABCESS DRAINAGE    . TONSILECTOMY, ADENOIDECTOMY, BILATERAL MYRINGOTOMY AND TUBES    . TYMPANOSTOMY TUBE PLACEMENT    . vascular leg      SOCIAL HISTORY: Social History   Tobacco Use  . Smoking status: Never Smoker  . Smokeless tobacco: Never Used  Substance Use Topics  . Alcohol use: Yes    Comment: 1-2 drinks socially  . Drug use: Never    FAMILY HISTORY: Family History  Problem Relation Age of Onset  . Hypertension Mother   . Heart disease Mother   . Stroke Mother   . Cancer Mother   . Anxiety disorder Mother   . Sleep apnea Mother   . Hyperlipidemia Father   . Hypertension Father   . Heart disease Father   . Thyroid disease Father   . Kidney disease Father   . Cancer Father     ROS: Review of Systems  Constitutional: Positive for weight loss.  Respiratory: Negative for sputum production.   Cardiovascular: Negative for chest pain.  Neurological: Negative for dizziness.    PHYSICAL EXAM: Blood pressure 122/82, pulse 69, temperature 98 F (36.7 C), temperature source Oral, height 5\' 3"  (1.6 m), weight 211 lb (95.7 kg), SpO2 96 %. Body mass index is 37.38 kg/m. Physical Exam  Constitutional: She is oriented to person, place, and time. She appears well-developed and well-nourished.  HENT:  Head: Normocephalic.  Cardiovascular: Normal rate.  Pulmonary/Chest: Effort normal.  Musculoskeletal: Normal range of motion.  Neurological: She is oriented to person, place, and time.  Skin: Skin is warm and dry.  Psychiatric: She has a normal mood and affect. Her behavior is normal.    RECENT LABS AND TESTS: BMET    Component Value Date/Time   NA 135 10/04/2017 0854   K 3.8 10/04/2017 0854   CL 98 10/04/2017 0854   CO2 23 10/04/2017 0854   GLUCOSE 82  10/04/2017 0854   BUN 18 10/04/2017 0854   CREATININE 0.59 10/04/2017 0854   CALCIUM 8.8 10/04/2017 0854   GFRNONAA 99 10/04/2017 0854   GFRAA 114 10/04/2017 0854   Lab Results  Component Value Date   HGBA1C 5.3 01/17/2018  HGBA1C 5.4 10/04/2017   HGBA1C 5.6 06/16/2017   HGBA1C 5.2 03/07/2017   Lab Results  Component Value Date   INSULIN 4.4 01/17/2018   INSULIN 7.7 10/04/2017   INSULIN 7.6 06/16/2017   INSULIN 7.8 03/07/2017   CBC    Component Value Date/Time   WBC 7.1 10/04/2017 0854   RBC 4.23 10/04/2017 0854   HGB 11.5 10/04/2017 0854   HCT 36.6 10/04/2017 0854   MCV 87 10/04/2017 0854   MCH 27.2 10/04/2017 0854   MCHC 31.4 (L) 10/04/2017 0854   RDW 17.6 (H) 10/04/2017 0854   LYMPHSABS 2.4 10/04/2017 0854   EOSABS 0.2 10/04/2017 0854   BASOSABS 0.0 10/04/2017 0854   Iron/TIBC/Ferritin/ %Sat    Component Value Date/Time   IRON 73 03/07/2017 0930   TIBC 401 03/07/2017 0930   FERRITIN 17 03/07/2017 0930   IRONPCTSAT 18 03/07/2017 0930   Lipid Panel     Component Value Date/Time   CHOL 172 10/04/2017 0854   TRIG 89 10/04/2017 0854   HDL 63 10/04/2017 0854   LDLCALC 91 10/04/2017 0854   Hepatic Function Panel     Component Value Date/Time   PROT 5.6 (L) 10/04/2017 0854   ALBUMIN 3.4 (L) 10/04/2017 0854   AST 11 10/04/2017 0854   ALT 8 10/04/2017 0854   ALKPHOS 77 10/04/2017 0854   BILITOT <0.2 10/04/2017 0854      Component Value Date/Time   TSH 4.100 06/16/2017 0945   TSH 3.440 03/07/2017 0930    ASSESSMENT AND PLAN: Essential hypertension  Class 2 severe obesity with serious comorbidity and body mass index (BMI) of 37.0 to 37.9 in adult, unspecified obesity type ([employer])  PLAN: Hypertension We discussed sodium restriction, working on healthy weight loss, and a regular exercise program as the means to achieve improved blood pressure control. Livvy agreed with this plan and agreed to follow up as directed. We will continue to monitor her blood  pressure as well as her progress with the above lifestyle modifications. She will continue her medications as prescribed and will watch for signs of hypotension as she continues her lifestyle modifications. We will recheck blood pressure in 1 month.   I spent > than 50% of the 15 minute visit on counseling as documented in the note.  Obesity Sakari is currently in the action stage of change. As such, her goal is to continue with weight loss efforts She has agreed to follow a lower carbohydrate, vegetable and lean protein rich diet plan Jayra has been instructed to work up to a goal of 150 minutes of combined cardio and strengthening exercise per week for weight loss and overall health benefits. We discussed the following Behavioral Modification Strategies today: increasing lean protein intake and decreasing simple carbohydrates    Puneet has agreed to follow up with our clinic in 4 weeks. She was informed of the importance of frequent follow up visits to maximize her success with intensive lifestyle modifications for her multiple health conditions.   OBESITY BEHAVIORAL INTERVENTION VISIT  Today's visit was # 15   Starting weight: 232 lb Starting date: 03/07/17 Today's weight : 211 lb Today's date: 02/07/18 Total lbs lost to date: 21 lb    ASK: We discussed the diagnosis of obesity with [name] today and Malachy Mood agreed to give [place] permission to discuss obesity behavioral modification therapy today.  ASSESS: Vaidehi has the diagnosis of obesity and her BMI today is 37.39 Chris is in the action stage of change  ADVISE: Katoya was educated on the multiple health risks of obesity as well as the benefit of weight loss to improve her health. She was advised of the need for long term treatment and the importance of lifestyle modifications to improve her current health and to decrease her risk of future health problems.  AGREE: Multiple dietary modification options and treatment  options were discussed and  Annaleah agreed to follow the recommendations documented in the above note.  ARRANGE: Kaveri was educated on the importance of frequent visits to treat obesity as outlined per CMS and USPSTF guidelines and agreed to schedule her next follow up appointment today.  I, Renee Ramus, am acting as transcriptionist for Dennard Nip, MD   I have reviewed the above documentation for accuracy and completeness, and I agree with the above. -Dennard Nip, MD

## 2018-03-13 ENCOUNTER — Ambulatory Visit (INDEPENDENT_AMBULATORY_CARE_PROVIDER_SITE_OTHER): Payer: BC Managed Care – PPO | Admitting: Family Medicine

## 2018-03-20 ENCOUNTER — Ambulatory Visit (INDEPENDENT_AMBULATORY_CARE_PROVIDER_SITE_OTHER): Payer: BC Managed Care – PPO | Admitting: Family Medicine

## 2018-03-20 VITALS — BP 124/82 | HR 61 | Temp 97.7°F | Ht 63.0 in | Wt 214.0 lb

## 2018-03-20 DIAGNOSIS — Z6837 Body mass index (BMI) 37.0-37.9, adult: Secondary | ICD-10-CM | POA: Diagnosis not present

## 2018-03-20 DIAGNOSIS — E8881 Metabolic syndrome: Secondary | ICD-10-CM | POA: Diagnosis not present

## 2018-03-20 DIAGNOSIS — I10 Essential (primary) hypertension: Secondary | ICD-10-CM

## 2018-03-20 NOTE — Progress Notes (Signed)
Office: [phone]  /  Fax: [phone]   HPI:   Chief Complaint: OBESITY Jenna Chang is here to discuss her progress with her obesity treatment plan. She is on the lower carbohydrate, vegetable and lean protein rich diet plan and is following her eating plan approximately 75 % of the time. She states she is walking 2 to 3 miles a day. Jenna Chang returned from a vacation in [place] and [place]. She did some hiking in [place]. She had a condo so she was eating on the plan for the most part, except for indulgences when sightseeing. She has a few trips scheduled for the next few weeks.  Her weight is 214 lb (97.1 kg) today and has had a weight gain of 3 pounds over a period of 5 weeks since her last visit. She has lost 18 lbs since starting treatment with [place].  Hypertension Jenna Chang is a 62 y.o. female with hypertension. Jenna Chang denies chest pain, chest pressure, or headache. She is working on weight loss to help control her blood pressure with the goal of decreasing her risk of heart attack and stroke. Jenna Chang's blood pressure is currently controlled today.  Insulin Resistance Jenna Chang has a diagnosis of insulin resistance based on her elevated fasting insulin level >5. Although Jenna Chang's blood glucose readings are still under good control, insulin resistance puts her at greater risk of metabolic syndrome and diabetes. She is on the low carb plan and has been making significant repeated indulgences when eating out.  ALLERGIES: Allergies  Allergen Reactions  . Trovafloxacin Anaphylaxis, Itching and Swelling    IV dosing only Tolerates levaquin without problems.  . Cefdinir Nausea And Vomiting  . Amlodipine Swelling  . Amlodipine Besylate Swelling  . Hydrochlorothiazide   . Losartan Cough  . Lisinopril Cough    MEDICATIONS: Current Outpatient Medications on File Prior to Visit  Medication Sig Dispense Refill  . albuterol (PROVENTIL HFA;VENTOLIN HFA) 108 (90 Base) MCG/ACT inhaler Inhale  into the lungs every 6 (six) hours as needed for wheezing or shortness of breath.    . Ascorbic Acid (VITAMIN C) 1000 MG tablet Take 1,000 mg by mouth daily.    Marland Kitchen aspirin EC 81 MG tablet Take 81 mg by mouth daily.    . carvedilol (COREG) 6.25 MG tablet Take 12.5 mg 2 (two) times daily with a meal by mouth.     . cyclobenzaprine (FLEXERIL) 5 MG tablet Take 5 mg by mouth 3 (three) times daily as needed for muscle spasms.    Marland Kitchen desmopressin (DDAVP) 0.2 MG tablet Take 0.2 mg by mouth daily.    Jenna Chang (AIMOVIG) 140 MG/ML SOAJ Inject 140 mg into the skin every 30 (thirty) days. 1 pen 11  . furosemide (LASIX) 20 MG tablet Take 20 mg by mouth daily as needed.     . Glycerin-Polysorbate 80 (REFRESH DRY EYE THERAPY OP) Apply 1 drop to eye 4 (four) times daily.    Marland Kitchen HYDROcodone-acetaminophen (NORCO) 7.5-325 MG tablet Take 1 tablet by mouth every 6 (six) hours as needed.     Marland Kitchen levocetirizine (XYZAL) 5 MG tablet Take 5 mg by mouth.    . mirabegron ER (MYRBETRIQ) 50 MG TB24 tablet Take by mouth.    . Ospemifene (OSPHENA) 60 MG TABS Take 1 tablet by mouth daily.    . pantoprazole (PROTONIX) 40 MG tablet Take by mouth 2 (two) times daily.     . polycarbophil (FIBERCON) 625 MG tablet Take 1,250 mg by mouth daily.    Marland Kitchen  travoprost, benzalkonium, (TRAVATAN) 0.004 % ophthalmic solution Place 1 drop into both eyes at bedtime.    . valACYclovir (VALTREX) 1000 MG tablet take 1 tablet by mouth twice a day if needed    . verapamil (CALAN) 120 MG tablet Take 240 mg once by mouth.      No current facility-administered medications on file prior to visit.     PAST MEDICAL HISTORY: Past Medical History:  Diagnosis Date  . Acid reflux   . Anemia   . Anxiety   . Back pain   . Bilateral swelling of feet   . Bursitis   . Cataract   . Chest pain   . Constipation   . Cough   . DDD (degenerative disc disease), cervical   . Depression   . Ear pain   . Gallbladder disease   . GERD (gastroesophageal reflux  disease)   . Hiatal hernia   . HTN (hypertension)   . IBS (irritable bowel syndrome)   . IC (interstitial cystitis)   . Low sodium levels   . Multinodular goiter   . OA (osteoarthritis)   . Osteopenia   . Scoliosis   . Sleep apnea   . Spinal stenosis   . Vitamin D deficiency     PAST SURGICAL HISTORY: Past Surgical History:  Procedure Laterality Date  . APPENDECTOMY    . CESAREAN SECTION    . CHOLECYSTECTOMY    . COLOSTOMY    . COLOSTOMY CLOSURE    . COLOSTOMY REVERSAL    . DG SCOLIOSIS SERIES ([facility])     rods placed  . HERNIA REPAIR    . INTERSTIM IMPLANT PLACEMENT    . NASAL SEPTUM SURGERY    . PELVIC ABCESS DRAINAGE    . TONSILECTOMY, ADENOIDECTOMY, BILATERAL MYRINGOTOMY AND TUBES    . TYMPANOSTOMY TUBE PLACEMENT    . vascular leg      SOCIAL HISTORY: Social History   Tobacco Use  . Smoking status: Never Smoker  . Smokeless tobacco: Never Used  Substance Use Topics  . Alcohol use: Yes    Comment: 1-2 drinks socially  . Drug use: Never    FAMILY HISTORY: Family History  Problem Relation Age of Onset  . Hypertension Mother   . Heart disease Mother   . Stroke Mother   . Cancer Mother   . Anxiety disorder Mother   . Sleep apnea Mother   . Hyperlipidemia Father   . Hypertension Father   . Heart disease Father   . Thyroid disease Father   . Kidney disease Father   . Cancer Father     ROS: Review of Systems  Constitutional: Negative for weight loss.  Cardiovascular: Negative for chest pain.       Negative for chest pressure.  Neurological: Negative for headaches.    PHYSICAL EXAM: Blood pressure 124/82, pulse 61, temperature 97.7 F (36.5 C), temperature source Oral, height 5\' 3"  (1.6 m), weight 214 lb (97.1 kg), SpO2 100 %. Body mass index is 37.91 kg/m. Physical Exam  Constitutional: She is oriented to person, place, and time. She appears well-developed and well-nourished.  Cardiovascular: Normal rate.  Pulmonary/Chest: Effort normal.    Musculoskeletal: Normal range of motion.  Neurological: She is oriented to person, place, and time.  Skin: Skin is warm and dry.  Psychiatric: She has a normal Chang and affect. Her behavior is normal.  Vitals reviewed.   RECENT LABS AND TESTS: BMET    Component Value Date/Time   NA  135 10/04/2017 0854   K 3.8 10/04/2017 0854   CL 98 10/04/2017 0854   CO2 23 10/04/2017 0854   GLUCOSE 82 10/04/2017 0854   BUN 18 10/04/2017 0854   CREATININE 0.59 10/04/2017 0854   CALCIUM 8.8 10/04/2017 0854   GFRNONAA 99 10/04/2017 0854   GFRAA 114 10/04/2017 0854   Lab Results  Component Value Date   HGBA1C 5.3 01/17/2018   HGBA1C 5.4 10/04/2017   HGBA1C 5.6 06/16/2017   HGBA1C 5.2 03/07/2017   Lab Results  Component Value Date   INSULIN 4.4 01/17/2018   INSULIN 7.7 10/04/2017   INSULIN 7.6 06/16/2017   INSULIN 7.8 03/07/2017   CBC    Component Value Date/Time   WBC 7.1 10/04/2017 0854   RBC 4.23 10/04/2017 0854   HGB 11.5 10/04/2017 0854   HCT 36.6 10/04/2017 0854   MCV 87 10/04/2017 0854   MCH 27.2 10/04/2017 0854   MCHC 31.4 (L) 10/04/2017 0854   RDW 17.6 (H) 10/04/2017 0854   LYMPHSABS 2.4 10/04/2017 0854   EOSABS 0.2 10/04/2017 0854   BASOSABS 0.0 10/04/2017 0854   Iron/TIBC/Ferritin/ %Sat    Component Value Date/Time   IRON 73 03/07/2017 0930   TIBC 401 03/07/2017 0930   FERRITIN 17 03/07/2017 0930   IRONPCTSAT 18 03/07/2017 0930   Lipid Panel     Component Value Date/Time   CHOL 172 10/04/2017 0854   TRIG 89 10/04/2017 0854   HDL 63 10/04/2017 0854   LDLCALC 91 10/04/2017 0854   Hepatic Function Panel     Component Value Date/Time   PROT 5.6 (L) 10/04/2017 0854   ALBUMIN 3.4 (L) 10/04/2017 0854   AST 11 10/04/2017 0854   ALT 8 10/04/2017 0854   ALKPHOS 77 10/04/2017 0854   BILITOT <0.2 10/04/2017 0854      Component Value Date/Time   TSH 4.100 06/16/2017 0945   TSH 3.440 03/07/2017 0930   Results for MALANIE, KOLOSKI (MRN [id number]) as of  03/20/2018 17:07  Ref. Range 01/17/2018 09:58  Vitamin D, 25-Hydroxy Latest Ref Range: 30.0 - 100.0 ng/mL 55.8   ASSESSMENT AND PLAN: Essential hypertension  Insulin resistance  Class 2 severe obesity with serious comorbidity and body mass index (BMI) of 37.0 to 37.9 in adult, unspecified obesity type (Jenna Chang)  PLAN:  Hypertension We discussed sodium restriction, working on healthy weight loss, and a regular exercise program as the means to achieve improved blood pressure control.  We will continue to monitor her blood pressure as well as her progress with the above lifestyle modifications. She will continue her medications as prescribed and will watch for signs of hypotension as she continues her lifestyle modifications. Jenna Chang agreed with this plan and agreed to follow up at her next appointment in 4 weeks.  Insulin Resistance Jenna Chang will continue to work on weight loss, exercise, and decreasing simple carbohydrates in her diet to help decrease the risk of diabetes. She was informed that eating too many simple carbohydrates or too many calories at one sitting increases the likelihood of GI side effects. We will repeat labs at her next appointment in 4 weeks. Jenna Chang agreed to follow up with [place] as directed to monitor her progress.  I spent > than 50% of the 15 minute visit on counseling as documented in the note.  Obesity Jenna Chang is currently in the action stage of change. As such, her goal is to continue with weight loss efforts. She has agreed to follow a lower carbohydrate, vegetable and lean protein  rich diet plan. Jenna Chang has been instructed to work up to a goal of 150 minutes of combined cardio and strengthening exercise per week for weight loss and overall health benefits. We discussed the following Behavioral Modification Strategies today: increasing lean protein intake, increasing vegetables, work on meal planning and easy cooking plans, and planning for success.  Jenna Chang has agreed to  follow up with our clinic in 4 weeks. She was informed of the importance of frequent follow up visits to maximize her success with intensive lifestyle modifications for her multiple health conditions.   OBESITY BEHAVIORAL INTERVENTION VISIT  Today's visit was # 16   Starting weight: 232 lbs Starting date: 03/07/17 Today's weight : Weight: 214 lb (97.1 kg)  Today's date: 03/20/2018 Total lbs lost to date: 59  ASK: We discussed the diagnosis of obesity with Jenna Chang today and Jenna Chang agreed to give [place] permission to discuss obesity behavioral modification therapy today.  ASSESS: Jenna Chang has the diagnosis of obesity and her BMI today is 37.92. Jenna Chang is in the action stage of change.   ADVISE: Jenna Chang was educated on the multiple health risks of obesity as well as the benefit of weight loss to improve her health. She was advised of the need for long term treatment and the importance of lifestyle modifications to improve her current health and to decrease her risk of future health problems.  AGREE: Multiple dietary modification options and treatment options were discussed and Jenna Chang agreed to follow the recommendations documented in the above note.  ARRANGE: Jenna Chang was educated on the importance of frequent visits to treat obesity as outlined per CMS and USPSTF guidelines and agreed to schedule her next follow up appointment today.  I, Marcille Blanco, am acting as Location manager for Eber Jones, MD  I have reviewed the above documentation for accuracy and completeness, and I agree with the above. - Ilene Qua, MD

## 2018-03-30 ENCOUNTER — Telehealth: Payer: Self-pay | Admitting: Neurology

## 2018-03-30 NOTE — Telephone Encounter (Signed)
Botox letter regarding [facility] mailed to patient °

## 2018-04-10 ENCOUNTER — Encounter: Payer: Self-pay | Admitting: *Deleted

## 2018-04-19 ENCOUNTER — Encounter (INDEPENDENT_AMBULATORY_CARE_PROVIDER_SITE_OTHER): Payer: Self-pay

## 2018-04-19 ENCOUNTER — Ambulatory Visit (INDEPENDENT_AMBULATORY_CARE_PROVIDER_SITE_OTHER): Payer: BC Managed Care – PPO | Admitting: Family Medicine

## 2018-04-24 ENCOUNTER — Telehealth: Payer: Self-pay | Admitting: Neurology

## 2018-04-24 NOTE — Telephone Encounter (Signed)
Pts husband [name] (on [place]) called stating he is returning RN Bethany's call assuming this is in regards to pts Aimovig  Please advise.

## 2018-04-24 NOTE — Telephone Encounter (Signed)
Spoke with pt's husband Loyd Isom. He stated that the pt has been getting her Aimovig from the pharmacy and will get it free for the first year. RN advised that [employer] may approve the Aimovig next year. He was advised to call back if they have any problems getting the Aimovig. He verbalized understanding. He stated that the Botox should be delivered within the next day or two and asked for a call if our office doesn't get it. RN advised that she would send this to [facility]. He verbalized appreciation.

## 2018-04-25 NOTE — Telephone Encounter (Signed)
Noted, thank you

## 2018-04-28 ENCOUNTER — Ambulatory Visit (INDEPENDENT_AMBULATORY_CARE_PROVIDER_SITE_OTHER): Payer: BC Managed Care – PPO | Admitting: Neurology

## 2018-04-28 ENCOUNTER — Encounter: Payer: Self-pay | Admitting: Neurology

## 2018-04-28 DIAGNOSIS — G43711 Chronic migraine without aura, intractable, with status migrainosus: Secondary | ICD-10-CM

## 2018-04-28 NOTE — Progress Notes (Signed)
Botox-100unitsx2 vials Lot: [id number] Expiration: 09/2020 NDC: 1518-3437-35 [id number]  0.9% Sodium Chloride- 25mL total Lot: [id number] Expiration: 02/2019 NDC: 3887-195974  Dx: X18.550 S/P

## 2018-04-28 NOTE — Progress Notes (Signed)
Consent Form Botulism Toxin Injection For Chronic Migraine  Interval history 04/28/2018: 2nd botox. Excellent response, she improved by >75% Baseline was Daily headaches and 15 migrainous headache days.  Reviewed orally with patient, additionally signature is on file:  Botulism toxin has been approved by the Federal drug administration for treatment of chronic migraine. Botulism toxin does not cure chronic migraine and it may not be effective in some patients.  The administration of botulism toxin is accomplished by injecting a small amount of toxin into the muscles of the neck and head. Dosage must be titrated for each individual. Any benefits resulting from botulism toxin tend to wear off after 3 months with a repeat injection required if benefit is to be maintained. Injections are usually done every 3-4 months with maximum effect peak achieved by about 2 or 3 weeks. Botulism toxin is expensive and you should be sure of what costs you will incur resulting from the injection.  The side effects of botulism toxin use for chronic migraine may include:   -Transient, and usually mild, facial weakness with facial injections  -Transient, and usually mild, head or neck weakness with head/neck injections  -Reduction or loss of forehead facial animation due to forehead muscle weakness  -Eyelid drooping  -Dry eye  -Pain at the site of injection or bruising at the site of injection  -Double vision  -Potential unknown long term risks  Contraindications: You should not have Botox if you are pregnant, nursing, allergic to albumin, have an infection, skin condition, or muscle weakness at the site of the injection, or have myasthenia gravis, Lambert-Eaton syndrome, or ALS.  It is also possible that as with any injection, there may be an allergic reaction or no effect from the medication. Reduced effectiveness after repeated injections is sometimes seen and rarely infection at the injection site may  occur. All care will be taken to prevent these side effects. If therapy is given over a long time, atrophy and wasting in the muscle injected may occur. Occasionally the patient's become refractory to treatment because they develop antibodies to the toxin. In this event, therapy needs to be modified.  I have read the above information and consent to the administration of botulism toxin.    BOTOX PROCEDURE NOTE FOR MIGRAINE HEADACHE    Contraindications and precautions discussed with patient(above). Aseptic procedure was observed and patient tolerated procedure. Procedure performed by Dr. Georgia Dom  The condition has existed for more than 6 months, and pt does not have a diagnosis of ALS, Myasthenia Gravis or Lambert-Eaton Syndrome.  Risks and benefits of injections discussed and pt agrees to proceed with the procedure.  Written consent obtained  These injections are medically necessary. Pt  receives good benefits from these injections. These injections do not cause sedations or hallucinations which the oral therapies may cause.  Description of procedure:  The patient was placed in a sitting position. The standard protocol was used for Botox as follows, with 5 units of Botox injected at each site:   -Procerus muscle, midline injection  -Corrugator muscle, bilateral injection  -Frontalis muscle, bilateral injection, with 2 sites each side, medial injection was performed in the upper one third of the frontalis muscle, in the region vertical from the medial inferior edge of the superior orbital rim. The lateral injection was again in the upper one third of the forehead vertically above the lateral limbus of the cornea, 1.5 cm lateral to the medial injection site.  - Levator Scapulae: 5 units bilaterally  -  Temporalis muscle injection, 5 sites, bilaterally. The first injection was 3 cm above the tragus of the ear, second injection site was 1.5 cm to 3 cm up from the first injection site in  line with the tragus of the ear. The third injection site was 1.5-3 cm forward between the first 2 injection sites. The fourth injection site was 1.5 cm posterior to the second injection site. 5th site laterally in the temporalis  muscleat the level of the outer canthus.  - Patient feels her clenching is a trigger for headaches. +5 units masseter bilaterally   - Patient feels the migraines are centered around the eyes +5 units bilaterally at the outer canthus in the orbicularis occuli  -Occipitalis muscle injection, 3 sites, bilaterally. The first injection was done one half way between the occipital protuberance and the tip of the mastoid process behind the ear. The second injection site was done lateral and superior to the first, 1 fingerbreadth from the first injection. The third injection site was 1 fingerbreadth superiorly and medially from the first injection site.  -Cervical paraspinal muscle injection, 2 sites, bilateral knee first injection site was 1 cm from the midline of the cervical spine, 3 cm inferior to the lower border of the occipital protuberance. The second injection site was 1.5 cm superiorly and laterally to the first injection site.  -Trapezius muscle injection was performed at 3 sites, bilaterally. The first injection site was in the upper trapezius muscle halfway between the inflection point of the neck, and the acromion. The second injection site was one half way between the acromion and the first injection site. The third injection was done between the first injection site and the inflection point of the neck.   Will return for repeat injection in 3 months.   A 200 unit sof Botox was used, any Botox not injected was wasted. The patient tolerated the procedure well, there were no complications of the above procedure.

## 2018-06-29 ENCOUNTER — Telehealth: Payer: Self-pay | Admitting: Neurology

## 2018-06-29 NOTE — Telephone Encounter (Signed)
Pt is asking for a call to r/s her Botox appointment °

## 2018-06-30 NOTE — Telephone Encounter (Signed)
I called the patient but she did not answer so I left a VM asking her to call me back.  °

## 2018-07-31 ENCOUNTER — Telehealth: Payer: Self-pay | Admitting: Neurology

## 2018-07-31 NOTE — Telephone Encounter (Signed)
Patient called in and stated she needed a PA sent in for her botox to her insurance company. Her appt is scheduled for 08/15/2018

## 2018-08-04 ENCOUNTER — Ambulatory Visit: Payer: BC Managed Care – PPO | Admitting: Neurology

## 2018-08-07 NOTE — Telephone Encounter (Signed)
Noted, thank you. DW  °

## 2018-08-14 ENCOUNTER — Other Ambulatory Visit: Payer: Self-pay | Admitting: *Deleted

## 2018-08-14 ENCOUNTER — Encounter: Payer: Self-pay | Admitting: *Deleted

## 2018-08-14 MED ORDER — METHYLPREDNISOLONE 4 MG PO TBPK: ORAL_TABLET | ORAL | 1 refills | Status: DC

## 2018-08-14 NOTE — Telephone Encounter (Signed)
I don't see where we have ever tried a medrol dosepak with patient and she doesn;t appear to have any significant contraindications. We can offer that for 5 days and maybe it''ll get her through until the office opens. Give her one refill so she can repeat it if necessary.   Take pills daily together in the morning with food for 6 days

## 2018-08-14 NOTE — Telephone Encounter (Signed)
I called the pt & LVM (ok per DPR) explaining the medrol dosepack and instructions that she can try. Advised we can provide 1 refill to repeat if needed. Also informed that I would send a mychart message. Per Dr. [name], pt can take daily pills together in the mornings with food for 6 days.

## 2018-08-15 ENCOUNTER — Ambulatory Visit: Payer: BC Managed Care – PPO | Admitting: Neurology

## 2018-08-16 ENCOUNTER — Other Ambulatory Visit: Payer: Self-pay | Admitting: Neurology

## 2018-08-16 MED ORDER — METOCLOPRAMIDE HCL 10 MG PO TABS: 10.0000 mg | ORAL_TABLET | Freq: Three times a day (TID) | ORAL | 1 refills | Status: DC

## 2018-08-16 NOTE — Telephone Encounter (Signed)
I spoke with the patient and discussed how she was feeling. She stated that she had not vomited today and was able to keep the steroid down. She is aware that we can bring her in for her botox but she would like to wait a little longer and see how she does. She is aware that we are closed on Friday. I asked her to call us back with an update. Advised that we could do the botox tomorrow or next week. Discussed Reglan prescription and common s/e. Advised there are more serious side effects. Advised pt of the medication information provided with prescription. She is aware of the instructions. She verbalized appreciation.   **If pt needs to come in office, she will need to be asked the covid questions for screening.

## 2018-11-01 ENCOUNTER — Other Ambulatory Visit: Payer: Self-pay

## 2018-11-01 ENCOUNTER — Ambulatory Visit (INDEPENDENT_AMBULATORY_CARE_PROVIDER_SITE_OTHER): Payer: BC Managed Care – PPO | Admitting: Neurology

## 2018-11-01 VITALS — Temp 97.8°F

## 2018-11-01 DIAGNOSIS — G43711 Chronic migraine without aura, intractable, with status migrainosus: Secondary | ICD-10-CM | POA: Diagnosis not present

## 2018-11-01 MED ORDER — RIZATRIPTAN BENZOATE 10 MG PO TBDP: 10.0000 mg | ORAL_TABLET | ORAL | 11 refills | Status: DC | PRN

## 2018-11-01 NOTE — Progress Notes (Signed)
Consent Form Botulism Toxin Injection For Chronic Migraine  Interval history 11/01/2018: 2nd botox. Excellent response, she improved by >75% Baseline was Daily headaches and 15 migrainous headache days. +OO, +Mass+temples, +LS. Migraines worsened when botox was delayed due to covid.   Acute management: Maxalt  Reviewed orally with patient, additionally signature is on file:  Botulism toxin has been approved by the Federal drug administration for treatment of chronic migraine. Botulism toxin does not cure chronic migraine and it may not be effective in some patients.  The administration of botulism toxin is accomplished by injecting a small amount of toxin into the muscles of the neck and head. Dosage must be titrated for each individual. Any benefits resulting from botulism toxin tend to wear off after 3 months with a repeat injection required if benefit is to be maintained. Injections are usually done every 3-4 months with maximum effect peak achieved by about 2 or 3 weeks. Botulism toxin is expensive and you should be sure of what costs you will incur resulting from the injection.  The side effects of botulism toxin use for chronic migraine may include:   -Transient, and usually mild, facial weakness with facial injections  -Transient, and usually mild, head or neck weakness with head/neck injections  -Reduction or loss of forehead facial animation due to forehead muscle weakness  -Eyelid drooping  -Dry eye  -Pain at the site of injection or bruising at the site of injection  -Double vision  -Potential unknown long term risks  Contraindications: You should not have Botox if you are pregnant, nursing, allergic to albumin, have an infection, skin condition, or muscle weakness at the site of the injection, or have myasthenia gravis, Lambert-Eaton syndrome, or ALS.  It is also possible that as with any injection, there may be an allergic reaction or no effect from the medication. Reduced  effectiveness after repeated injections is sometimes seen and rarely infection at the injection site may occur. All care will be taken to prevent these side effects. If therapy is given over a long time, atrophy and wasting in the muscle injected may occur. Occasionally the patient's become refractory to treatment because they develop antibodies to the toxin. In this event, therapy needs to be modified.  I have read the above information and consent to the administration of botulism toxin.    BOTOX PROCEDURE NOTE FOR MIGRAINE HEADACHE    Contraindications and precautions discussed with patient(above). Aseptic procedure was observed and patient tolerated procedure. Procedure performed by Dr. Georgia Dom  The condition has existed for more than 6 months, and pt does not have a diagnosis of ALS, Myasthenia Gravis or Lambert-Eaton Syndrome.  Risks and benefits of injections discussed and pt agrees to proceed with the procedure.  Written consent obtained  These injections are medically necessary. Pt  receives good benefits from these injections. These injections do not cause sedations or hallucinations which the oral therapies may cause.  Description of procedure:  The patient was placed in a sitting position. The standard protocol was used for Botox as follows, with 5 units of Botox injected at each site:   -Procerus muscle, midline injection  -Corrugator muscle, bilateral injection  -Frontalis muscle, bilateral injection, with 2 sites each side, medial injection was performed in the upper one third of the frontalis muscle, in the region vertical from the medial inferior edge of the superior orbital rim. The lateral injection was again in the upper one third of the forehead vertically above the lateral limbus of  the cornea, 1.5 cm lateral to the medial injection site.  - Levator Scapulae: 5 units bilaterally  -Temporalis muscle injection, 5 sites, bilaterally. The first injection was 3 cm  above the tragus of the ear, second injection site was 1.5 cm to 3 cm up from the first injection site in line with the tragus of the ear. The third injection site was 1.5-3 cm forward between the first 2 injection sites. The fourth injection site was 1.5 cm posterior to the second injection site. 5th site laterally in the temporalis  muscleat the level of the outer canthus.  - Patient feels her clenching is a trigger for headaches. +5 units masseter bilaterally   - Patient feels the migraines are centered around the eyes +5 units bilaterally at the outer canthus in the orbicularis occuli  -Occipitalis muscle injection, 3 sites, bilaterally. The first injection was done one half way between the occipital protuberance and the tip of the mastoid process behind the ear. The second injection site was done lateral and superior to the first, 1 fingerbreadth from the first injection. The third injection site was 1 fingerbreadth superiorly and medially from the first injection site.  -Cervical paraspinal muscle injection, 2 sites, bilateral knee first injection site was 1 cm from the midline of the cervical spine, 3 cm inferior to the lower border of the occipital protuberance. The second injection site was 1.5 cm superiorly and laterally to the first injection site.  -Trapezius muscle injection was performed at 3 sites, bilaterally. The first injection site was in the upper trapezius muscle halfway between the inflection point of the neck, and the acromion. The second injection site was one half way between the acromion and the first injection site. The third injection was done between the first injection site and the inflection point of the neck.   Will return for repeat injection in 3 months.   A 200 unit sof Botox was used, any Botox not injected was wasted. The patient tolerated the procedure well, there were no complications of the above procedure.

## 2018-11-01 NOTE — Progress Notes (Signed)
Botox- 100 units x 2 vials Lot: [id number] Expiration: 12/2020 NDC: 0023-1145-01  Bacteriostatic 0.9% Sodium Chloride- 4mL total Lot: [id number] Expiration: 02/15/2019 NDC: 0409-1966-02  Dx: G43.711 S/P    

## 2018-11-01 NOTE — Patient Instructions (Signed)
Rizatriptan(Maxalt): Please take one tablet at the onset of your headache. If it does not improve the symptoms please take one additional tablet in 2 hours. Do not take more then 2 tablets in 24hrs. Do not take use more then 2 to 3 times in a week.  Rizatriptan tablets What is this medicine? RIZATRIPTAN (rye za TRIP tan) is used to treat migraines with or without aura. An aura is a strange feeling or visual disturbance that warns you of an attack. It is not used to prevent migraines. This medicine may be used for other purposes; ask your health care provider or pharmacist if you have questions. COMMON BRAND NAME(S): Maxalt What should I tell my health care provider before I take this medicine? They need to know if you have any of these conditions: -cigarette smoker -circulation problems in fingers and toes -diabetes -heart disease -high blood pressure -high cholesterol -history of irregular heartbeat -history of stroke -kidney disease -liver disease -stomach or intestine problems -an unusual or allergic reaction to rizatriptan, other medicines, foods, dyes, or preservatives -pregnant or trying to get pregnant -breast-feeding How should I use this medicine? Take this medicine by mouth with a glass of water. Follow the directions on the prescription label. Do not take it more often than directed. Talk to your pediatrician regarding the use of this medicine in children. While this drug may be prescribed for children as young as 6 years for selected conditions, precautions do apply. Overdosage: If you think you have taken too much of this medicine contact a poison control center or emergency room at once. NOTE: This medicine is only for you. Do not share this medicine with others. What if I miss a dose? This does not apply. This medicine is not for regular use. What may interact with this medicine? Do not take this medicine with any of the following medicines: -certain medicines for  migraine headache like almotriptan, eletriptan, frovatriptan, naratriptan, rizatriptan, sumatriptan, zolmitriptan -ergot alkaloids like dihydroergotamine, ergonovine, ergotamine, methylergonovine -MAOIs like Carbex, Eldepryl, Marplan, Nardil, and Parnate This medicine may also interact with the following medications: -certain medicines for depression, anxiety, or psychotic disorders -propranolol This list may not describe all possible interactions. Give your health care provider a list of all the medicines, herbs, non-prescription drugs, or dietary supplements you use. Also tell them if you smoke, drink alcohol, or use illegal drugs. Some items may interact with your medicine. What should I watch for while using this medicine? Visit your healthcare professional for regular checks on your progress. Tell your healthcare professional if your symptoms do not start to get better or if they get worse. You may get drowsy or dizzy. Do not drive, use machinery, or do anything that needs mental alertness until you know how this medicine affects you. Do not stand up or sit up quickly, especially if you are an older patient. This reduces the risk of dizzy or fainting spells. Alcohol may interfere with the effect of this medicine. Your mouth may get dry. Chewing sugarless gum or sucking hard candy and drinking plenty of water may help. Contact your healthcare professional if the problem does not go away or is severe. If you take migraine medicines for 10 or more days a month, your migraines may get worse. Keep a diary of headache days and medicine use. Contact your healthcare professional if your migraine attacks occur more frequently. What side effects may I notice from receiving this medicine? Side effects that you should report to your doctor or  health care professional as soon as possible: -allergic reactions like skin rash, itching or hives, swelling of the face, lips, or tongue -chest pain or chest  tightness -signs and symptoms of a dangerous change in heartbeat or heart rhythm like chest pain; dizziness; fast, irregular heartbeat; palpitations; feeling faint or lightheaded; falls; breathing problems -signs and symptoms of a stroke like changes in vision; confusion; trouble speaking or understanding; severe headaches; sudden numbness or weakness of the face, arm or leg; trouble walking; dizziness; loss of balance or coordination -signs and symptoms of serotonin syndrome like irritable; confusion; diarrhea; fast or irregular heartbeat; muscle twitching; stiff muscles; trouble walking; sweating; high fever; seizures; chills; vomiting Side effects that usually do not require medical attention (report to your doctor or health care professional if they continue or are bothersome): -diarrhea -dizziness -drowsiness -dry mouth -headache -nausea, vomiting -pain, tingling, numbness in the hands or feet -stomach pain This list may not describe all possible side effects. Call your doctor for medical advice about side effects. You may report side effects to FDA at 1-800-FDA-1088. Where should I keep my medicine? Keep out of the reach of children. Store at room temperature between 15 and 30 degrees C (59 and 86 degrees F). Keep container tightly closed. Throw away any unused medicine after the expiration date. NOTE: This sheet is a summary. It may not cover all possible information. If you have questions about this medicine, talk to your doctor, pharmacist, or health care provider.  2019 Elsevier/Gold Standard (2017-11-15 14:59:59)

## 2018-11-07 ENCOUNTER — Telehealth: Payer: Self-pay | Admitting: Neurology

## 2018-11-07 NOTE — Telephone Encounter (Signed)
I called to schedule the patient but she did not answer so I left a VM asking her to call me back. DW

## 2019-01-30 ENCOUNTER — Other Ambulatory Visit: Payer: Self-pay | Admitting: *Deleted

## 2019-01-30 MED ORDER — BOTOX 100 UNITS IJ SOLR: INTRAMUSCULAR | 2 refills | Status: DC

## 2019-02-12 ENCOUNTER — Ambulatory Visit (INDEPENDENT_AMBULATORY_CARE_PROVIDER_SITE_OTHER): Payer: BC Managed Care – PPO | Admitting: Neurology

## 2019-02-12 ENCOUNTER — Other Ambulatory Visit: Payer: Self-pay

## 2019-02-12 ENCOUNTER — Other Ambulatory Visit: Payer: Self-pay | Admitting: Neurology

## 2019-02-12 VITALS — BP 149/87 | HR 82 | Temp 97.7°F | Wt 236.0 lb

## 2019-02-12 DIAGNOSIS — G43711 Chronic migraine without aura, intractable, with status migrainosus: Secondary | ICD-10-CM

## 2019-02-12 NOTE — Progress Notes (Signed)
Botox 100 units x2  [id number] 04 2023  [id number] 04 2023  Sodium chloride  0.36ml  [id number] Expires 09/20   Dx: [id number] S/P

## 2019-02-12 NOTE — Progress Notes (Signed)
Consent Form Botulism Toxin Injection For Chronic Migraine  Interval history 02/12/2019: 4th botox. Excellent response, she improved by >75% Baseline was Daily headaches and 15 migrainous headache days. +a. Migraines worsened when botox was delayed due to covid.   Acute management: Maxalt given  Reviewed orally with patient, additionally signature is on file:  Botulism toxin has been approved by the Federal drug administration for treatment of chronic migraine. Botulism toxin does not cure chronic migraine and it may not be effective in some patients.  The administration of botulism toxin is accomplished by injecting a small amount of toxin into the muscles of the neck and head. Dosage must be titrated for each individual. Any benefits resulting from botulism toxin tend to wear off after 3 months with a repeat injection required if benefit is to be maintained. Injections are usually done every 3-4 months with maximum effect peak achieved by about 2 or 3 weeks. Botulism toxin is expensive and you should be sure of what costs you will incur resulting from the injection.  The side effects of botulism toxin use for chronic migraine may include:   -Transient, and usually mild, facial weakness with facial injections  -Transient, and usually mild, head or neck weakness with head/neck injections  -Reduction or loss of forehead facial animation due to forehead muscle weakness  -Eyelid drooping  -Dry eye  -Pain at the site of injection or bruising at the site of injection  -Double vision  -Potential unknown long term risks  Contraindications: You should not have Botox if you are pregnant, nursing, allergic to albumin, have an infection, skin condition, or muscle weakness at the site of the injection, or have myasthenia gravis, Lambert-Eaton syndrome, or ALS.  It is also possible that as with any injection, there may be an allergic reaction or no effect from the medication. Reduced effectiveness  after repeated injections is sometimes seen and rarely infection at the injection site may occur. All care will be taken to prevent these side effects. If therapy is given over a long time, atrophy and wasting in the muscle injected may occur. Occasionally the patient's become refractory to treatment because they develop antibodies to the toxin. In this event, therapy needs to be modified.  I have read the above information and consent to the administration of botulism toxin.    BOTOX PROCEDURE NOTE FOR MIGRAINE HEADACHE    Contraindications and precautions discussed with patient(above). Aseptic procedure was observed and patient tolerated procedure. Procedure performed by Dr. Georgia Dom  The condition has existed for more than 6 months, and pt does not have a diagnosis of ALS, Myasthenia Gravis or Lambert-Eaton Syndrome.  Risks and benefits of injections discussed and pt agrees to proceed with the procedure.  Written consent obtained  These injections are medically necessary. Pt  receives good benefits from these injections. These injections do not cause sedations or hallucinations which the oral therapies may cause.  Description of procedure:  The patient was placed in a sitting position. The standard protocol was used for Botox as follows, with 5 units of Botox injected at each site:   -Procerus muscle, midline injection  -Corrugator muscle, bilateral injection  -Frontalis muscle, bilateral injection, with 2 sites each side, medial injection was performed in the upper one third of the frontalis muscle, in the region vertical from the medial inferior edge of the superior orbital rim. The lateral injection was again in the upper one third of the forehead vertically above the lateral limbus of the  cornea, 1.5 cm lateral to the medial injection site.   -Temporalis muscle injection, 4 sites, bilaterally. The first injection was 3 cm above the tragus of the ear, second injection site was 1.5  cm to 3 cm up from the first injection site in line with the tragus of the ear. The third injection site was 1.5-3 cm forward between the first 2 injection sites. The fourth injection site was 1.5 cm posterior to the second injection site.  -Occipitalis muscle injection, 3 sites, bilaterally. The first injection was done one half way between the occipital protuberance and the tip of the mastoid process behind the ear. The second injection site was done lateral and superior to the first, 1 fingerbreadth from the first injection. The third injection site was 1 fingerbreadth superiorly and medially from the first injection site.  -Cervical paraspinal muscle injection, 2 sites, bilateral knee first injection site was 1 cm from the midline of the cervical spine, 3 cm inferior to the lower border of the occipital protuberance. The second injection site was 1.5 cm superiorly and laterally to the first injection site.  -Trapezius muscle injection was performed at 3 sites, bilaterally. The first injection site was in the upper trapezius muscle halfway between the inflection point of the neck, and the acromion. The second injection site was one half way between the acromion and the first injection site. The third injection was done between the first injection site and the inflection point of the neck.   Will return for repeat injection in 3 months.   A 200 unit sof Botox was used, any Botox not injected was wasted. The patient tolerated the procedure well, there were no complications of the above procedure.

## 2019-04-10 ENCOUNTER — Other Ambulatory Visit: Payer: Self-pay | Admitting: *Deleted

## 2019-04-10 MED ORDER — BOTOX 100 UNITS IJ SOLR: INTRAMUSCULAR | 1 refills | Status: DC

## 2019-04-16 NOTE — Telephone Encounter (Signed)
Jenna Chang, can you ask Dr. [name] what is the deal with this one? Should she order medication as well?

## 2019-04-24 ENCOUNTER — Ambulatory Visit (INDEPENDENT_AMBULATORY_CARE_PROVIDER_SITE_OTHER): Payer: BC Managed Care – PPO | Admitting: Neurology

## 2019-04-24 ENCOUNTER — Other Ambulatory Visit: Payer: Self-pay

## 2019-04-24 VITALS — Temp 97.4°F

## 2019-04-24 DIAGNOSIS — G43711 Chronic migraine without aura, intractable, with status migrainosus: Secondary | ICD-10-CM

## 2019-04-24 MED ORDER — NURTEC 75 MG PO TBDP: 75.0000 mg | ORAL_TABLET | Freq: Every day | ORAL | 6 refills | Status: DC | PRN

## 2019-04-24 NOTE — Progress Notes (Signed)
Botox- 200 units x 1 vial Lot: [id number] Expiration: 11/21 NDC: [id number]  Bacteriostatic 0.9% Sodium Chloride- 60mL total Lot: [id number] Expiration: 08/16/2019 NDC: [id number]  Dx: [id number] sample

## 2019-04-24 NOTE — Patient Instructions (Signed)
[employer]: Patient drug information Access [employer] here. Copyright [phone] [employer] All rights reserved. (For additional information see "[employer]: Drug information") Brand Names: [place]  Nurtec  What is this drug used for?   It is used to treat migraine headaches.  What do I need to tell my doctor BEFORE I take this drug?   If you are allergic to this drug; any part of this drug; or any other drugs, foods, or substances. Tell your doctor about the allergy and what signs you had.   If you have any of these health problems: Kidney disease or liver disease.   If you take any drugs (prescription or OTC, natural products, vitamins) that must not be taken with this drug, like certain drugs that are used for HIV, infections, or seizures. There are many drugs that must not be taken with this drug.   This is not a list of all drugs or health problems that interact with this drug.   Tell your doctor and pharmacist about all of your drugs (prescription or OTC, natural products, vitamins) and health problems. You must check to make sure that it is safe for you to take this drug with all of your drugs and health problems. Do not start, stop, or change the dose of any drug without checking with your doctor.  What are some things I need to know or do while I take this drug?   Tell all of your health care providers that you take this drug. This includes your doctors, nurses, pharmacists, and dentists.   This drug is not meant to prevent or lower the number of migraine headaches you get.   Tell your doctor if you are pregnant, plan on getting pregnant, or are breast-feeding. You will need to talk about the benefits and risks to you and the baby.  What are some side effects that I need to call my doctor about right away?   WARNING/CAUTION: Even though it may be rare, some people may have very bad and sometimes deadly side effects when taking a drug. Tell your doctor or get medical help right  away if you have any of the following signs or symptoms that may be related to a very bad side effect:   Signs of an allergic reaction, like rash; hives; itching; red, swollen, blistered, or peeling skin with or without fever; wheezing; tightness in the chest or throat; trouble breathing, swallowing, or talking; unusual hoarseness; or swelling of the mouth, face, lips, tongue, or throat.  What are some other side effects of this drug?   All drugs may cause side effects. However, many people have no side effects or only have minor side effects. Call your doctor or get medical help if any of these side effects or any other side effects bother you or do not go away:   Upset stomach.   These are not all of the side effects that may occur. If you have questions about side effects, call your doctor. Call your doctor for medical advice about side effects.   You may report side effects to your national health agency.  How is this drug best taken?   Use this drug as ordered by your doctor. Read all information given to you. Follow all instructions closely.   Do not push the tablet out of the foil when opening. Use dry hands to take it from the foil. Place on your tongue and let it dissolve. Water is not needed. Do not swallow it whole. Do  not chew, break, or crush it.   If needed, you may place the tablet under the tongue.   Use right after opening.  What do I do if I miss a dose?   This drug is taken on an as needed basis. Do not take more often than told by the doctor.  How do I store and/or throw out this drug?   Store at room temperature in a dry place. Do not store in a bathroom.   Store in foil pouch until ready for use.   Keep all drugs in a safe place. Keep all drugs out of the reach of children and pets.   Throw away unused or expired drugs. Do not flush down a toilet or pour down a drain unless you are told to do so. Check with your pharmacist if you have questions about the best way to throw  out drugs. There may be drug take-back programs in your area.  General drug facts   If your symptoms or health problems do not get better or if they become worse, call your doctor.   Do not share your drugs with others and do not take anyone else's drugs.   Some drugs may have another patient information leaflet. If you have any questions about this drug, please talk with your doctor, nurse, pharmacist, or other health care provider.   If you think there has been an overdose, call your poison control center or get medical care right away. Be ready to tell or show what was taken, how much, and when it happened.

## 2019-04-24 NOTE — Progress Notes (Addendum)
Consent Form Botulism Toxin Injection For Chronic Migraine  Interval history 04/24/2019: Lots of stress. Has had a headache for 4 days but still significantly improved. Try nurtec in addition to maxalt. . Excellent response, she improved by >75% Baseline was Daily headaches and 15 migrainous headache days. +a. Migraines worsened when botox was delayed due to covid.   Acute management: Maxalt given, add nurtec  Meds ordered this encounter  Medications  . Rimegepant Sulfate (NURTEC) 75 MG TBDP    Sig: Take 75 mg by mouth daily as needed. For migraines. Take as close to onset of migraine as possible. One daily maximum.    Dispense:  10 tablet    Refill:  6     Reviewed orally with patient, additionally signature is on file:  Botulism toxin has been approved by the Federal drug administration for treatment of chronic migraine. Botulism toxin does not cure chronic migraine and it may not be effective in some patients.  The administration of botulism toxin is accomplished by injecting a small amount of toxin into the muscles of the neck and head. Dosage must be titrated for each individual. Any benefits resulting from botulism toxin tend to wear off after 3 months with a repeat injection required if benefit is to be maintained. Injections are usually done every 3-4 months with maximum effect peak achieved by about 2 or 3 weeks. Botulism toxin is expensive and you should be sure of what costs you will incur resulting from the injection.  The side effects of botulism toxin use for chronic migraine may include:   -Transient, and usually mild, facial weakness with facial injections  -Transient, and usually mild, head or neck weakness with head/neck injections  -Reduction or loss of forehead facial animation due to forehead muscle weakness  -Eyelid drooping  -Dry eye  -Pain at the site of injection or bruising at the site of injection  -Double vision  -Potential unknown long term risks   Contraindications: You should not have Botox if you are pregnant, nursing, allergic to albumin, have an infection, skin condition, or muscle weakness at the site of the injection, or have myasthenia gravis, Lambert-Eaton syndrome, or ALS.  It is also possible that as with any injection, there may be an allergic reaction or no effect from the medication. Reduced effectiveness after repeated injections is sometimes seen and rarely infection at the injection site may occur. All care will be taken to prevent these side effects. If therapy is given over a long time, atrophy and wasting in the muscle injected may occur. Occasionally the patient's become refractory to treatment because they develop antibodies to the toxin. In this event, therapy needs to be modified.  I have read the above information and consent to the administration of botulism toxin.    BOTOX PROCEDURE NOTE FOR MIGRAINE HEADACHE    Contraindications and precautions discussed with patient(above). Aseptic procedure was observed and patient tolerated procedure. Procedure performed by Dr. Georgia Dom  The condition has existed for more than 6 months, and pt does not have a diagnosis of ALS, Myasthenia Gravis or Lambert-Eaton Syndrome.  Risks and benefits of injections discussed and pt agrees to proceed with the procedure.  Written consent obtained  These injections are medically necessary. Pt  receives good benefits from these injections. These injections do not cause sedations or hallucinations which the oral therapies may cause.  Description of procedure:  The patient was placed in a sitting position. The standard protocol was used for Botox as  follows, with 5 units of Botox injected at each site:   -Procerus muscle, midline injection  -Corrugator muscle, bilateral injection  -Frontalis muscle, bilateral injection, with 2 sites each side, medial injection was performed in the upper one third of the frontalis muscle, in the  region vertical from the medial inferior edge of the superior orbital rim. The lateral injection was again in the upper one third of the forehead vertically above the lateral limbus of the cornea, 1.5 cm lateral to the medial injection site.   -Temporalis muscle injection, 4 sites, bilaterally. The first injection was 3 cm above the tragus of the ear, second injection site was 1.5 cm to 3 cm up from the first injection site in line with the tragus of the ear. The third injection site was 1.5-3 cm forward between the first 2 injection sites. The fourth injection site was 1.5 cm posterior to the second injection site.  -Occipitalis muscle injection, 3 sites, bilaterally. The first injection was done one half way between the occipital protuberance and the tip of the mastoid process behind the ear. The second injection site was done lateral and superior to the first, 1 fingerbreadth from the first injection. The third injection site was 1 fingerbreadth superiorly and medially from the first injection site.  -Cervical paraspinal muscle injection, 2 sites, bilateral knee first injection site was 1 cm from the midline of the cervical spine, 3 cm inferior to the lower border of the occipital protuberance. The second injection site was 1.5 cm superiorly and laterally to the first injection site.  -Trapezius muscle injection was performed at 3 sites, bilaterally. The first injection site was in the upper trapezius muscle halfway between the inflection point of the neck, and the acromion. The second injection site was one half way between the acromion and the first injection site. The third injection was done between the first injection site and the inflection point of the neck.   Will return for repeat injection in 3 months.   A 200 unit sof Botox was used, any Botox not injected was wasted. The patient tolerated the procedure well, there were no complications of the above procedure.  A total of 40 minutes was  spent face-to-face with this patient. Over half this time was spent on counseling patient on the  1. Chronic migraine without aura, with intractable migraine, so stated, with status migrainosus    diagnosis and different diagnostic and therapeutic options, counseling and coordination of care, risks ans benefits of management, compliance, or risk factor reduction and education.

## 2019-05-01 ENCOUNTER — Telehealth: Payer: Self-pay | Admitting: *Deleted

## 2019-05-01 NOTE — Telephone Encounter (Signed)
Nurtec PA completed on CMM. Key: [id number]. Awaiting [facility] determination. May get denied because of requirement to try two triptans. Pt has tried Maxalt.

## 2019-05-03 ENCOUNTER — Encounter: Payer: Self-pay | Admitting: *Deleted

## 2019-05-03 NOTE — Telephone Encounter (Signed)
Received a denial from [employer]. Medication requires step therapy of two triptans or that the patient cannot use them. The patient has Pharmacist, community and can use the savings card for the Nurtec despite the denial. I sent pt a mychart message about this.   If we should choose to appeal the decision, fax to [phone].

## 2019-05-16 ENCOUNTER — Ambulatory Visit: Payer: BC Managed Care – PPO | Admitting: Neurology

## 2019-05-21 ENCOUNTER — Encounter: Payer: Self-pay | Admitting: *Deleted

## 2019-05-21 NOTE — Telephone Encounter (Signed)
We received another PA to complete with a new ID number. Still with [facility] [employer]. Key: [id number]. Awaiting determination.

## 2019-05-21 NOTE — Telephone Encounter (Signed)
error 

## 2019-07-31 ENCOUNTER — Other Ambulatory Visit: Payer: Self-pay

## 2019-07-31 ENCOUNTER — Ambulatory Visit (INDEPENDENT_AMBULATORY_CARE_PROVIDER_SITE_OTHER): Payer: BC Managed Care – PPO | Admitting: Neurology

## 2019-07-31 DIAGNOSIS — G43711 Chronic migraine without aura, intractable, with status migrainosus: Secondary | ICD-10-CM

## 2019-07-31 MED ORDER — AIMOVIG 140 MG/ML ~~LOC~~ SOAJ: 140.0000 mg | SUBCUTANEOUS | 12 refills | Status: DC

## 2019-07-31 MED ORDER — NURTEC 75 MG PO TBDP: 75.0000 mg | ORAL_TABLET | Freq: Every day | ORAL | 6 refills | Status: DC | PRN

## 2019-07-31 MED ORDER — METOCLOPRAMIDE HCL 10 MG PO TABS: 10.0000 mg | ORAL_TABLET | Freq: Three times a day (TID) | ORAL | 3 refills | Status: DC

## 2019-07-31 MED ORDER — RIZATRIPTAN BENZOATE 10 MG PO TBDP: 10.0000 mg | ORAL_TABLET | ORAL | 11 refills | Status: DC | PRN

## 2019-07-31 NOTE — Progress Notes (Signed)
Consent Form Botulism Toxin Injection For Chronic Migraine  Interval history 07/31/2019: Excellent response, she improved almost 100%, she has only had 1 headache since last botox.  Baseline was Daily headaches and 15 migrainous headache days. She clenches and has discomfort in the levator scapulae.. Migraines worsened when botox was delayed due to covid.   Acute management: Maxalt given, added nurtec and she liked it, gave prescription  Meds ordered this encounter  Medications  . Rimegepant Sulfate (NURTEC) 75 MG TBDP    Sig: Take 75 mg by mouth daily as needed. For migraines. Take as close to onset of migraine as possible. One daily maximum.    Dispense:  10 tablet    Refill:  6  . rizatriptan (MAXALT-MLT) 10 MG disintegrating tablet    Sig: Take 1 tablet (10 mg total) by mouth as needed for migraine. May repeat in 2 hours if needed. Max 2x in one day.    Dispense:  9 tablet    Refill:  11  . metoCLOPramide (REGLAN) 10 MG tablet    Sig: Take 1 tablet (10 mg total) by mouth 3 (three) times daily.    Dispense:  60 tablet    Refill:  3  . Erenumab-aooe (AIMOVIG) 140 MG/ML SOAJ    Sig: Inject 140 mg into the skin every 30 (thirty) days.    Dispense:  140 mg    Refill:  12     Reviewed orally with patient, additionally signature is on file:  Botulism toxin has been approved by the Federal drug administration for treatment of chronic migraine. Botulism toxin does not cure chronic migraine and it may not be effective in some patients.  The administration of botulism toxin is accomplished by injecting a small amount of toxin into the muscles of the neck and head. Dosage must be titrated for each individual. Any benefits resulting from botulism toxin tend to wear off after 3 months with a repeat injection required if benefit is to be maintained. Injections are usually done every 3-4 months with maximum effect peak achieved by about 2 or 3 weeks. Botulism toxin is expensive and you should  be sure of what costs you will incur resulting from the injection.  The side effects of botulism toxin use for chronic migraine may include:   -Transient, and usually mild, facial weakness with facial injections  -Transient, and usually mild, head or neck weakness with head/neck injections  -Reduction or loss of forehead facial animation due to forehead muscle weakness  -Eyelid drooping  -Dry eye  -Pain at the site of injection or bruising at the site of injection  -Double vision  -Potential unknown long term risks  Contraindications: You should not have Botox if you are pregnant, nursing, allergic to albumin, have an infection, skin condition, or muscle weakness at the site of the injection, or have myasthenia gravis, Lambert-Eaton syndrome, or ALS.  It is also possible that as with any injection, there may be an allergic reaction or no effect from the medication. Reduced effectiveness after repeated injections is sometimes seen and rarely infection at the injection site may occur. All care will be taken to prevent these side effects. If therapy is given over a long time, atrophy and wasting in the muscle injected may occur. Occasionally the patient's become refractory to treatment because they develop antibodies to the toxin. In this event, therapy needs to be modified.  I have read the above information and consent to the administration of botulism toxin.  BOTOX PROCEDURE NOTE FOR MIGRAINE HEADACHE    Contraindications and precautions discussed with patient(above). Aseptic procedure was observed and patient tolerated procedure. Procedure performed by Dr. Georgia Dom  The condition has existed for more than 6 months, and pt does not have a diagnosis of ALS, Myasthenia Gravis or Lambert-Eaton Syndrome.  Risks and benefits of injections discussed and pt agrees to proceed with the procedure.  Written consent obtained  These injections are medically necessary. Pt  receives good benefits  from these injections. These injections do not cause sedations or hallucinations which the oral therapies may cause.  Description of procedure:  The patient was placed in a sitting position. The standard protocol was used for Botox as follows, with 5 units of Botox injected at each site:   -Procerus muscle, midline injection  -Corrugator muscle, bilateral injection  -Frontalis muscle, bilateral injection, with 2 sites each side, medial injection was performed in the upper one third of the frontalis muscle, in the region vertical from the medial inferior edge of the superior orbital rim. The lateral injection was again in the upper one third of the forehead vertically above the lateral limbus of the cornea, 1.5 cm lateral to the medial injection site.   -Temporalis muscle injection, 4 sites, bilaterally. The first injection was 3 cm above the tragus of the ear, second injection site was 1.5 cm to 3 cm up from the first injection site in line with the tragus of the ear. The third injection site was 1.5-3 cm forward between the first 2 injection sites. The fourth injection site was 1.5 cm posterior to the second injection site.  -Occipitalis muscle injection, 3 sites, bilaterally. The first injection was done one half way between the occipital protuberance and the tip of the mastoid process behind the ear. The second injection site was done lateral and superior to the first, 1 fingerbreadth from the first injection. The third injection site was 1 fingerbreadth superiorly and medially from the first injection site.  -Cervical paraspinal muscle injection, 2 sites, bilateral knee first injection site was 1 cm from the midline of the cervical spine, 3 cm inferior to the lower border of the occipital protuberance. The second injection site was 1.5 cm superiorly and laterally to the first injection site.  -Trapezius muscle injection was performed at 3 sites, bilaterally. The first injection site was in the  upper trapezius muscle halfway between the inflection point of the neck, and the acromion. The second injection site was one half way between the acromion and the first injection site. The third injection was done between the first injection site and the inflection point of the neck.   Will return for repeat injection in 3 months.   A 200 unit sof Botox was used, any Botox not injected was wasted. The patient tolerated the procedure well, there were no complications of the above procedure.

## 2019-07-31 NOTE — Progress Notes (Signed)
Botox- 100 units x 2 vials Lot: [id number] Expiration: 03/2022 NDC: [id number]  Bacteriostatic 0.9% Sodium Chloride- 80mL total Lot: [id number] Expiration: 08/16/2019 NDC: [id number]  Dx: [id number] S/P

## 2019-08-02 ENCOUNTER — Telehealth: Payer: Self-pay | Admitting: *Deleted

## 2019-08-02 NOTE — Telephone Encounter (Signed)
Aimovig PA received. I called the pt and LVM (ok per DPR) asking if she had specifically noticed a reduction in her migraine days per month with the Aimovig. I asked for a call back or mychart message. Left office number and hours in message.

## 2019-08-02 NOTE — Telephone Encounter (Signed)
Pt responded in mychart. Aimovig does help. I completed PA on Cover My Meds. Key: [id number]. Awaiting [employer] determination.

## 2019-08-02 NOTE — Telephone Encounter (Signed)
Per Cover My Meds, [facility] has approved the Aimovig from 08/02/2019 - 08/01/2020. I sent the pt a mychart message with the update.

## 2019-08-02 NOTE — Telephone Encounter (Signed)
Completed Nurtec PA on CMM. Key: [id number]. Awaiting [employer] determination.

## 2019-08-07 ENCOUNTER — Encounter: Payer: Self-pay | Admitting: *Deleted

## 2019-08-07 NOTE — Telephone Encounter (Signed)
Aimovig 140 mg PA approved by [facility] [employer] from 08/02/19-08/01/20. I faxed approval to pt's pharmacy. Received a receipt of confirmation.

## 2019-08-07 NOTE — Telephone Encounter (Signed)
Received denial from [facility] [employer]. Nurtec denied d/t insurance requirement that patient try/fail two triptans or note that she cannot use them. If we should choose to appeal, fax to [facility] dept Level 1 @ [phone].   Pt should be able to use the savings card for $0 copay. I sent pt a mychart message.

## 2019-08-14 NOTE — Telephone Encounter (Signed)
Spoke with pt. She has been r/s to 10/31/19. Pt was also transferred to the billing manager to discuss the info  Needed for botox reimbursement. Her questions were answered.

## 2019-10-02 ENCOUNTER — Telehealth: Payer: Self-pay | Admitting: Neurology

## 2019-10-02 NOTE — Telephone Encounter (Signed)
Faxed Botox continuation PA to [facility] [phone])

## 2019-10-02 NOTE — Telephone Encounter (Signed)
Submitted PA request via [place]. Received paperwork from [facility] via fax. Filled out continuation PA and gave to MD to sign.

## 2019-10-03 ENCOUNTER — Other Ambulatory Visit: Payer: Self-pay | Admitting: Neurology

## 2019-10-08 NOTE — Telephone Encounter (Signed)
Request for continuation of Botox was denied by [facility] because patient is also taking Aimovig.

## 2019-10-08 NOTE — Telephone Encounter (Signed)
Received request from [facility] for more information regarding patient's medications. Pharmacist wanted to know if patient will get Botox along with Aimovig. I did not see anything in patient's chart stating that she plans on stopping the Aimovig, neither did Therapist, sports. Faxed paperwork back to [facility]. Awaiting response.

## 2019-10-09 NOTE — Telephone Encounter (Signed)
I will call her this afternoon.

## 2019-10-09 NOTE — Telephone Encounter (Signed)
Which one of you will call and speak to her?

## 2019-10-09 NOTE — Telephone Encounter (Signed)
I spoke with Dr. [name]. Will need to let pt know insurance will not cover both and see which one she wants to do.

## 2019-10-09 NOTE — Telephone Encounter (Signed)
I called the pt and LVM (ok per DPR) letting her know unfortunately [employer] has denied the Botox this time. Insurance will not cover both the Aimovig and Botox. I let pt know Dr [name] would like her to choose which one she wants. Left office number for call back. Advised pt she can message [place] in [place] instead if she wants.

## 2019-10-16 NOTE — Telephone Encounter (Signed)
I called patient and let her know to keep her 6/16 appointment. I told her we would do the Botox at this appointment but Dr. [name] would talk to her about possible solutions/future options. She verbalized understanding.

## 2019-10-16 NOTE — Telephone Encounter (Signed)
Patient's husband returned our call regarding the Botox denial. He states that this is disappointing because of how well the Aimovig and Botox work for patient. I told them that I understand the frustration, but insurance will not cover both. Patient is not yet sure what she wants to do. Will patient need to come in for a follow-up appointment?

## 2019-10-16 NOTE — Telephone Encounter (Signed)
She has a botox appointment with me on 6/16, tell her to keep it and let us do the botox that day and we can talk at that time. I may have a solution but I'll need to talk to at appointment thanks

## 2019-10-16 NOTE — Telephone Encounter (Signed)
Noted  

## 2019-10-30 ENCOUNTER — Telehealth: Payer: Self-pay | Admitting: Neurology

## 2019-10-30 NOTE — Telephone Encounter (Signed)
error 

## 2019-10-30 NOTE — Telephone Encounter (Signed)
Great. Thank you.

## 2019-10-30 NOTE — Progress Notes (Signed)
[facility]    Provider:  Dr [name] Referring Provider: Parke Poisson, MD Primary Care Physician:  Parke Poisson, MD  CC:  Chronic daily headache  Interval history 6/16/201: STOPPING AIMOVIG.  Patient is here to discuss Botox.  Patient has a long history of migraines, migraine started 30 years ago, at baseline patient with daily headaches and 15 migrainous headaches a day, no medication overuse, no aura, ongoing at this frequency for greater than a year, migraines last 24 to 72 hours, and are severe.  We initiated Botox with patient, in 2019.  Even after her first Botox patient had an excellent response, she improved by greater than 75% in migraine frequency and severity.  Patient did excellent on Botox.  We did lay her on Aimovig after that to see if she would significantly improve, but she did not significantly improved, we are going to stop the Aimovig, continue Botox which is excellent and has changed patient's life.  HPI:  Jenna Chang is a 64 y.o. female here as a referral from Dr. Doyle Askew for chronic daily headaches.  Past medical history interstitial cystitis, pulmonary hypertension, reflux, asthma, urge incontinence, migraine, rhinitis, osteopenia, multinodular goiter, pharyngeal esophageal dysphasia, cataracts, at risk for glaucoma, brow ptosis, dry eye, hiatal hernia, tubes in ears twice, scoliosis, perianal abscess, fistula. Migraines started 30 years ago and was diagnosed with migraines. She has daily headaches, around the eyes, unilateral but can be either, She uses cpap every night, she started having daily headaches last September. No inciting events except in the setting of cpap. She stopped Trazodone. She still feels tired. But she says the headaches were before the daily headaches. Also vision changes. They are behind the eyes, can radiate into the nose, can be sharp, dull constant, continuous, can be pulsating and throbbing, light sound sensitivity, smells  trigger, +nausea and vomiting. Daily headaches and 15 migrainous headache days, no medication overuse, no aura, ongoing at this frequency > 6 months, can be severe, wakes with headaches and can be positional.  15 migrainous. No medication overuse, not using OTC meds or hydrocodone more than 1-2x a week. More fatigued. Headache worsens as the day goes on.  NO aura. No other focal neurologic deficits, associated symptoms, inciting events or modifiable factors.  Reviewed notes, labs and imaging from outside physicians, which showed:   Reviewed referring physician notes.  She is here with her husband.  She has a history of migraines in September 2018 the headaches became daily, she is had issues with vision before that, is change glasses for different times, the headaches remained daily.  Also obesity, placed on Wellbutrin which did help her cravings initially.  Headaches every day since September.  She has had a glasses replaced with lenses changes well 3 times.  Headaches behind eyes mainly, more so behind the left eye, notes some days worse than others, she does have a history of "cluster headaches", overmedicated by a prior provider for such, recently bumped heads with grandson as he was jumping into the pool 1 week ago, pain in left temple area getting better, also occasional swelling in the legs.  TSH normal  Medications tried: Flexeril, she is on Norco, verapamil, carvedilol, magnesium, wellbutrin, trazodone.   Review of Systems:  Patient complains of symptoms per HPI as well as the following symptoms:headache,fatigue . Pertinent negatives and positives per HPI. All others negative   Social History   Socioeconomic History  . Marital status: Married    Spouse name: [name]  .  Number of children: 2  . Years of education: Not on file  . Highest education level: Not on file  Occupational History  . Occupation: Retired Pharmacist, hospital  Tobacco Use  . Smoking status: Never Smoker  . Smokeless tobacco:  Never Used  Vaping Use  . Vaping Use: Never used  Substance and Sexual Activity  . Alcohol use: Yes    Comment: 1-2 drinks socially  . Drug use: Never  . Sexual activity: Not on file  Other Topics Concern  . Not on file  Social History Narrative  . Not on file   Social Determinants of Health   Financial Resource Strain:   . Difficulty of Paying Living Expenses:   Food Insecurity:   . Worried About Charity fundraiser in the Last Year:   . Arboriculturist in the Last Year:   Transportation Needs:   . Film/video editor (Medical):   Marland Kitchen Lack of Transportation (Non-Medical):   Physical Activity:   . Days of Exercise per Week:   . Minutes of Exercise per Session:   Stress:   . Feeling of Stress :   Social Connections:   . Frequency of Communication with Friends and Family:   . Frequency of Social Gatherings with Friends and Family:   . Attends Religious Services:   . Active Member of Clubs or Organizations:   . Attends Archivist Meetings:   Marland Kitchen Marital Status:   Intimate Partner Violence:   . Fear of Current or Ex-Partner:   . Emotionally Abused:   Marland Kitchen Physically Abused:   . Sexually Abused:     Family History  Problem Relation Age of Onset  . Hypertension Mother   . Heart disease Mother   . Stroke Mother   . Cancer Mother   . Anxiety disorder Mother   . Sleep apnea Mother   . Hyperlipidemia Father   . Hypertension Father   . Heart disease Father   . Thyroid disease Father   . Kidney disease Father   . Cancer Father     Past Medical History:  Diagnosis Date  . Acid reflux   . Anemia   . Anxiety   . Back pain   . Bilateral swelling of feet   . Bursitis   . Cancer ([place])    basal cell skin ca  . Cataract   . Chest pain   . Constipation   . Cough   . DDD (degenerative disc disease), cervical   . Depression   . Ear pain   . Gallbladder disease   . GERD (gastroesophageal reflux disease)   . Hiatal hernia   . HTN (hypertension)   . IBS  (irritable bowel syndrome)   . IC (interstitial cystitis)   . Low sodium levels   . Multinodular goiter   . OA (osteoarthritis)   . Osteopenia   . Scoliosis   . Sleep apnea   . Spinal stenosis   . Vitamin D deficiency     Past Surgical History:  Procedure Laterality Date  . APPENDECTOMY    . CESAREAN SECTION    . CHOLECYSTECTOMY    . COLOSTOMY    . COLOSTOMY CLOSURE    . COLOSTOMY REVERSAL    . DG SCOLIOSIS SERIES ([place] [facility])     rods placed  . HERNIA REPAIR    . INTERSTIM IMPLANT PLACEMENT    . NASAL SEPTUM SURGERY    . PELVIC ABCESS DRAINAGE    . TONSILECTOMY, ADENOIDECTOMY,  BILATERAL MYRINGOTOMY AND TUBES    . TYMPANOSTOMY TUBE PLACEMENT    . vascular leg      Current Outpatient Medications  Medication Sig Dispense Refill  . albuterol (PROVENTIL HFA;VENTOLIN HFA) 108 (90 Base) MCG/ACT inhaler Inhale into the lungs every 6 (six) hours as needed for wheezing or shortness of breath.    Marland Kitchen amoxicillin-clavulanate (AUGMENTIN) 875-125 MG tablet Take 1 tablet by mouth 2 (two) times daily.    Marland Kitchen BOTOX 100 units SOLR injection PROVIDER TO INJECT 155 UNITS INTO THE MUSCLES OF THE HEAD AND NECK EVERY 3 MONTHS. DISCARD REMAINDER. 2 each 0  . CALCIUM-VITAMIN D PO Take by mouth. 315 mg-5 mcg (200 unit)    . carvedilol (COREG) 6.25 MG tablet Take 12.5 mg 2 (two) times daily with a meal by mouth.     . Cholecalciferol (VITAMIN D3 PO) Take 1,000 Units by mouth.    Mariane Baumgarten Calcium (STOOL SOFTENER PO) Take 2 each by mouth in the morning and at bedtime.    [name] (AIMOVIG) 140 MG/ML SOAJ Inject 140 mg into the skin every 30 (thirty) days. 140 mg 12  . fluticasone (FLONASE) 50 MCG/ACT nasal spray in the morning and at bedtime.    . fluticasone furoate-vilanterol (BREO ELLIPTA) 200-25 MCG/INH AEPB Inhale into the lungs. For 28 days    . furosemide (LASIX) 20 MG tablet Take 20 mg by mouth daily as needed.     . Glycerin-Polysorbate 80 (REFRESH DRY EYE THERAPY OP) Apply 1 drop to eye 4  (four) times daily.    Marland Kitchen levocetirizine (XYZAL) 5 MG tablet Take 5 mg by mouth.    Marland Kitchen LORazepam (ATIVAN) 0.5 MG tablet Take 0.5 mg by mouth daily as needed.    . metoCLOPramide (REGLAN) 10 MG tablet Take 1 tablet (10 mg total) by mouth 3 (three) times daily. 60 tablet 3  . mirabegron ER (MYRBETRIQ) 50 MG TB24 tablet Take by mouth.    . montelukast (SINGULAIR) 10 MG tablet Take 10 mg by mouth daily.    Marland Kitchen OVER THE COUNTER MEDICATION Fiber, Dextrin Oral    . pantoprazole (PROTONIX) 40 MG tablet Take by mouth 2 (two) times daily.     . polyethylene glycol (MIRALAX / GLYCOLAX) packet Take 17 g by mouth 2 (two) times a week. As needed.    . predniSONE (DELTASONE) 10 MG tablet Take 10 mg by mouth daily with breakfast. 3 days left as of 10/31/2019    . Rimegepant Sulfate (NURTEC) 75 MG TBDP Take 75 mg by mouth daily as needed. For migraines. Take as close to onset of migraine as possible. One daily maximum. 10 tablet 6  . rizatriptan (MAXALT-MLT) 10 MG disintegrating tablet Take 1 tablet (10 mg total) by mouth as needed for migraine. May repeat in 2 hours if needed. Max 2x in one day. 9 tablet 11  . travoprost, benzalkonium, (TRAVATAN) 0.004 % ophthalmic solution Place 1 drop into both eyes at bedtime.    . valACYclovir (VALTREX) 1000 MG tablet take 1 tablet by mouth twice a day if needed    . verapamil (CALAN) 120 MG tablet Take 240 mg by mouth. Every morning.    . verapamil (CALAN-SR) 180 MG CR tablet Take 180 mg by mouth at bedtime.     No current facility-administered medications for this visit.    Allergies as of 10/31/2019 - Review Complete 10/31/2019  Allergen Reaction Noted  . Trovafloxacin Anaphylaxis, Itching, and Swelling 07/05/2013  . Cefdinir Nausea And Vomiting  08/07/2012  . Amlodipine Swelling 11/07/2012  . Amlodipine besylate Swelling 11/26/2010  . Hydrochlorothiazide  03/07/2017  . Losartan Cough 03/07/2017  . Lisinopril Cough 10/24/2013    Vitals: LMP  (LMP Unknown)  Last  Weight:  Wt Readings from Last 1 Encounters:  02/12/19 236 lb (107 kg)   Last Height:   Ht Readings from Last 1 Encounters:  03/20/18 5\' 3"  (1.6 m)   Physical exam: Exam: Gen: NAD, conversant, well nourised, obese, well groomed                     CV: RRR, no MRG. No Carotid Bruits. No peripheral edema, warm, nontender Eyes: Conjunctivae clear without exudates or hemorrhage  Neuro: Detailed Neurologic Exam  Speech:    Speech is normal; fluent and spontaneous with normal comprehension.  Cognition:    The patient is oriented to person, place, and time;     recent and remote memory intact;     language fluent;     normal attention, concentration,     fund of knowledge Cranial Nerves:    The pupils are equal, round, and reactive to light. The fundi are normal and spontaneous venous pulsations are present. Visual fields are full to finger confrontation. Extraocular movements are intact. Trigeminal sensation is intact and the muscles of mastication are normal. The face is symmetric. The palate elevates in the midline. Hearing intact. Voice is normal. Shoulder shrug is normal. The tongue has normal motion without fasciculations.   Coordination:    Normal finger to nose and heel to shin. Normal rapid alternating movements.   Gait:    Heel-toe and tandem gait are normal.   Motor Observation:    No asymmetry, no atrophy, and no involuntary movements noted. Tone:    Normal muscle tone.    Posture:    Posture is normal. normal erect    Strength:    Strength is V/V in the upper and lower limbs.      Sensation: intact to LT     Reflex Exam:  DTR's:    Deep tendon reflexes in the upper and lower extremities are normal bilaterally.   Toes:    The toes are downgoing bilaterally.   Clonus:    Clonus is absent.  Assessment/Plan:  64 year old with daily intractable headaches and migraines, chronic, no aura, no medication overuse.   -   We initiated Botox with patient in 2019.   Even just after her first Botox, patient had an excellent response, she improved by greater than 75% in migraine frequency and severity.  Patient has continued to do excellent on Botox.  We did try her on Aimovig many months after starting botox, but she did not significantly improve with additional Aimovig and the Botox is the treatment that has significantly improved the quality of her life and reduced visits to doctors. She is no longer taking Aimovig, she has STOPPED the Aimovig, continue Botox which is excellent and has changed patient's life.  Medications tried: Flexeril, verapamil, carvedilol, magnesium, wellbutrin, trazodone, Topiramate, celexa, amitriptyline,gabapentin  No orders of the defined types were placed in this encounter.  Discussed: To prevent or relieve headaches, try the following: Cool Compress. Lie down and place a cool compress on your head.  Avoid headache triggers. If certain foods or odors seem to have triggered your migraines in the past, avoid them. A headache diary might help you identify triggers.  Include physical activity in your daily routine. Try a daily walk  or other moderate aerobic exercise.  Manage stress. Find healthy ways to cope with the stressors, such as delegating tasks on your to-do list.  Practice relaxation techniques. Try deep breathing, yoga, massage and visualization.  Eat regularly. Eating regularly scheduled meals and maintaining a healthy diet might help prevent headaches. Also, drink plenty of fluids.  Follow a regular sleep schedule. Sleep deprivation might contribute to headaches Consider biofeedback. With this mind-body technique, you learn to control certain bodily functions -- such as muscle tension, heart rate and blood pressure -- to prevent headaches or reduce headache pain.    Proceed to emergency room if you experience new or worsening symptoms or symptoms do not resolve, if you have new neurologic symptoms or if headache is severe, or  for any concerning symptom.   Provided education and documentation from American headache Society toolbox including articles on: chronic migraine medication overuse headache, chronic migraines, prevention of migraines, behavioral and other nonpharmacologic treatments for headache.   Cc: Dr. Staci Acosta, MD  [facility] [address] [place], [place] [address]  Phone [phone] Fax [phone]  I spent 45 minutes of face-to-face and non-face-to-face time with patient on the  1. Chronic migraine without aura, with intractable migraine, so stated, with status migrainosus    diagnosis.  This included previsit chart review, lab review, study review, order entry, electronic health record documentation, patient education on the different diagnostic and therapeutic options, counseling and coordination of care, risks and benefits of management, compliance, or risk factor reduction

## 2019-10-30 NOTE — Telephone Encounter (Signed)
FYI- I organized Botox fridge today and found that patient already has (2) 100U vials of Botox, I assume from previous SP delivery. Looks like patient previously used [facility], which no longer dispenses Botox unless you have these [place], [place], [place], [place], [place]. Patient has [place]. I guess it would be okay to go ahead and use those 2 vials for her appointment tomorrow instead of samples?

## 2019-10-30 NOTE — Telephone Encounter (Signed)
Sure sounds good to me.

## 2019-10-31 ENCOUNTER — Ambulatory Visit (INDEPENDENT_AMBULATORY_CARE_PROVIDER_SITE_OTHER): Payer: BC Managed Care – PPO | Admitting: Neurology

## 2019-10-31 DIAGNOSIS — G43711 Chronic migraine without aura, intractable, with status migrainosus: Secondary | ICD-10-CM

## 2019-10-31 NOTE — Progress Notes (Signed)
Botox- 100 units x 2 vials Lot: [id number] Expiration: 01/2022 NDC: 2591-0289-02  Bacteriostatic 0.9% Sodium Chloride- 5mL total Lot: [id number] Expiration: 11/15/2019 NDC: 8614-8307-35  Dx: Q30.148 S/P

## 2019-11-06 ENCOUNTER — Ambulatory Visit: Payer: BC Managed Care – PPO | Admitting: Neurology

## 2019-11-06 NOTE — Telephone Encounter (Signed)
FYI [employer] reviewed and approved PA request after patient stopped Aimovig. PA #[id number], valid 10/01/19 to 09/29/20.

## 2019-11-06 NOTE — Telephone Encounter (Signed)
noted 

## 2020-01-24 ENCOUNTER — Telehealth: Payer: Self-pay | Admitting: *Deleted

## 2020-01-24 DIAGNOSIS — G43711 Chronic migraine without aura, intractable, with status migrainosus: Secondary | ICD-10-CM

## 2020-01-24 MED ORDER — BOTOX 100 UNITS IJ SOLR: INTRAMUSCULAR | 1 refills | Status: DC

## 2020-01-24 NOTE — Telephone Encounter (Signed)
-----   Message from Allegra Lai sent at 01/24/2020  9:49 AM EDT ----- Regarding: Botox Can you please send patient's Botox prescription to [facility]?

## 2020-02-04 NOTE — Telephone Encounter (Signed)
FYI Dr. [name]- everything has been done on my end.

## 2020-02-04 NOTE — Telephone Encounter (Signed)
I called [facility] and spoke with Tammy to verify that they had PA and prescription info. She verified that it is on file.

## 2020-02-05 NOTE — Telephone Encounter (Signed)
Per phone room, patient's husband called to cancel her appointment tomorrow and will call back later on to r/s. FYI

## 2020-02-06 ENCOUNTER — Ambulatory Visit: Payer: BC Managed Care – PPO | Admitting: Neurology

## 2020-02-11 IMAGING — CT CT HEAD WO/W CM
1 of 2 series · 13 of 30 positions shown, 17 images · non-contrast
Comparison: none

[Series 2: head w/(date) · axial · 0.45mm/px · z∈[+997,+1147]mm · 13 of 36 slices shown, 17 images]
[im 3/36  brain]
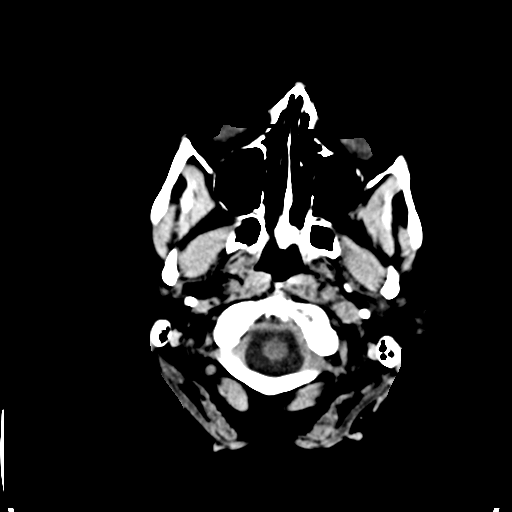
[im 3/36  bone]
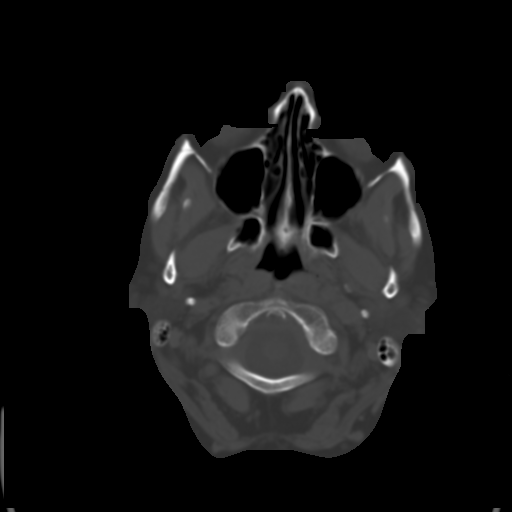
[im 6/36  brain]
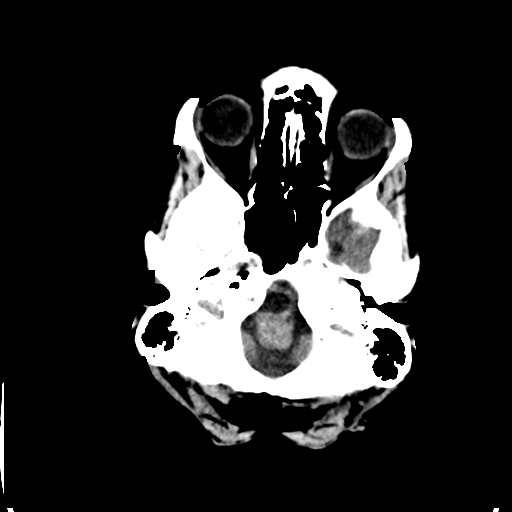
[im 8/36  brain]
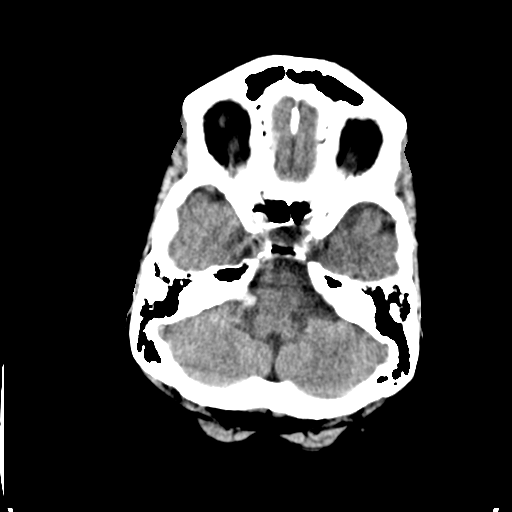
[im 11/36  brain]
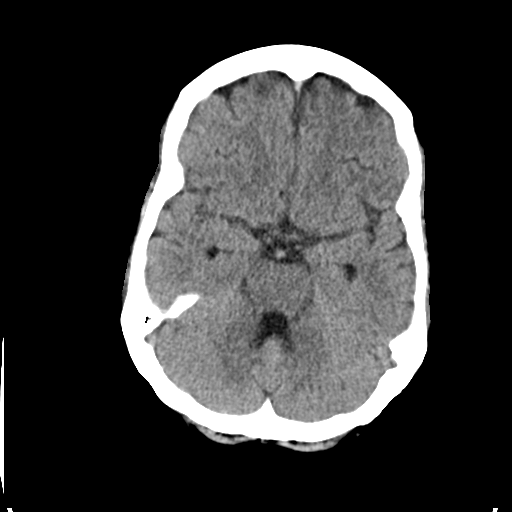
[im 13/36  brain]
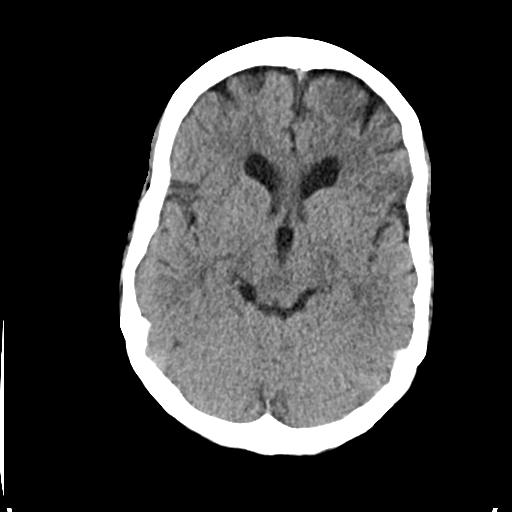
[im 13/36  bone]
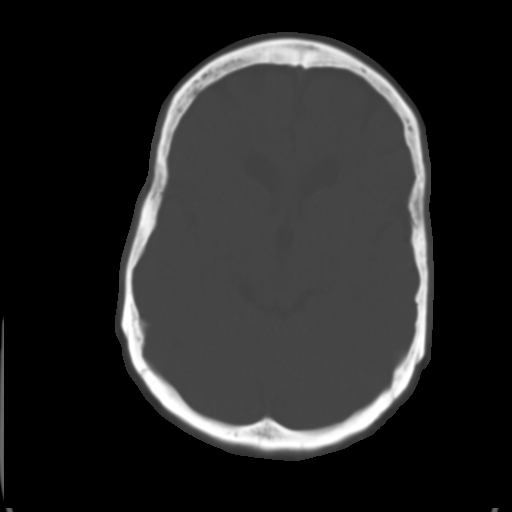
[im 16/36  brain]
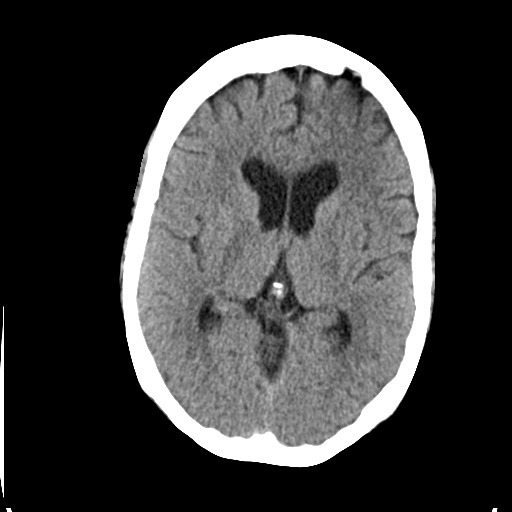
[im 18/36  brain]
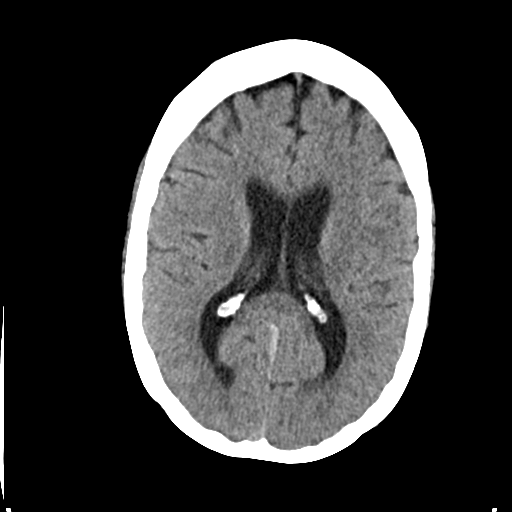
[im 21/36  brain]
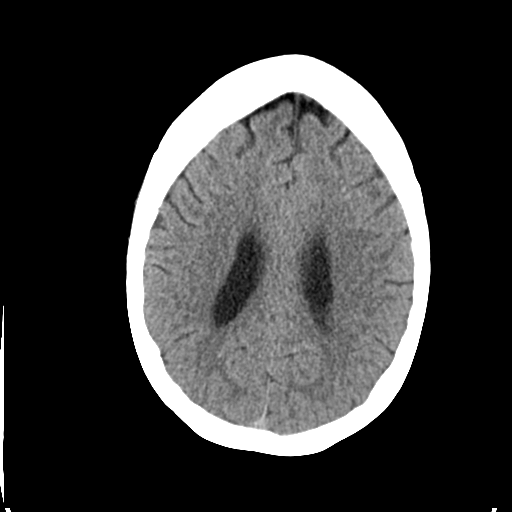
[im 23/36  brain]
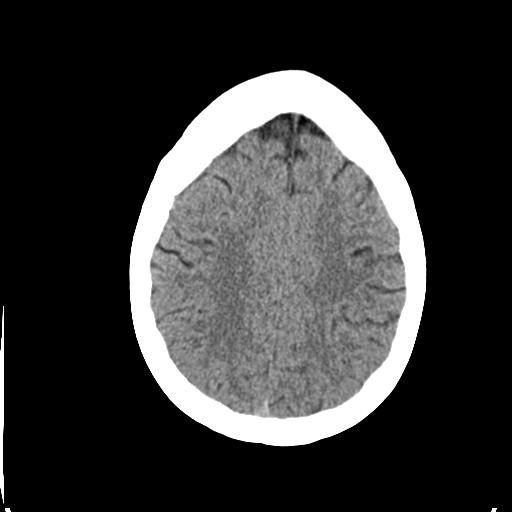
[im 23/36  bone]
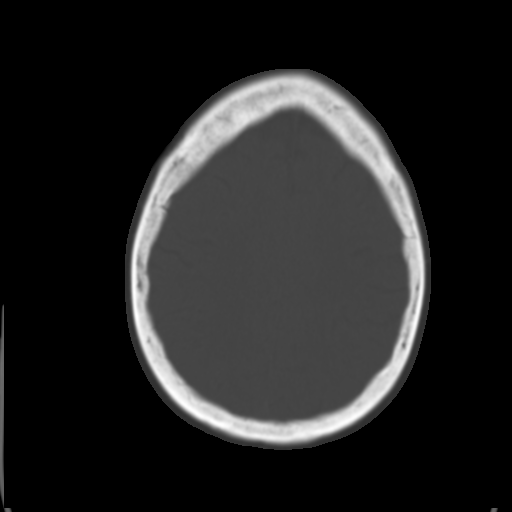
[im 26/36  brain]
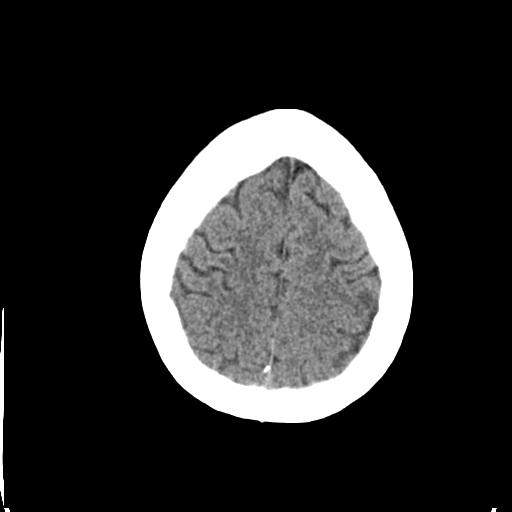
[im 28/36  brain]
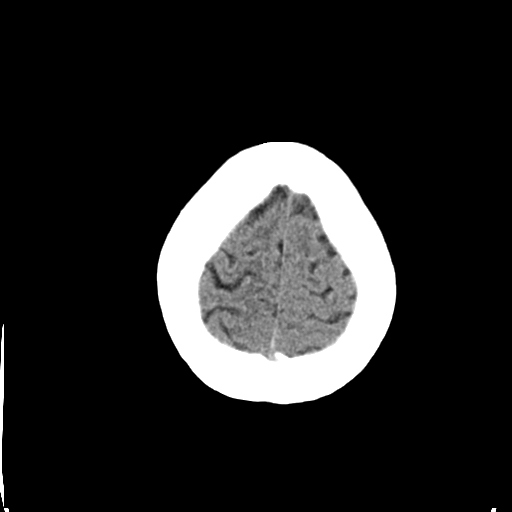
[im 31/36  brain]
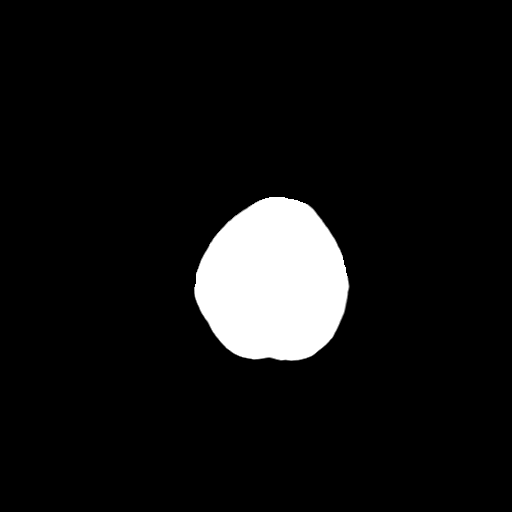
[im 33/36  brain]
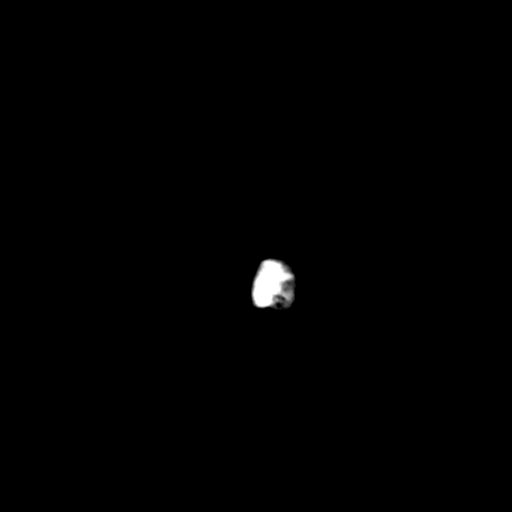
[im 33/36  bone]
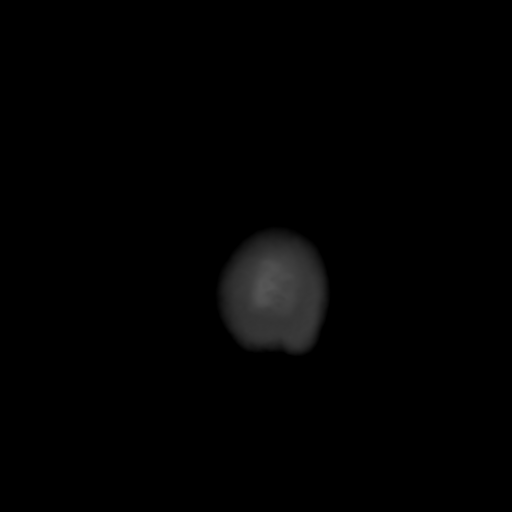

[13 of 30 positions shown; findings below may reference images not displayed]

Canned report from images found in remote index.

Refer to host system for actual result text.

## 2020-03-11 ENCOUNTER — Telehealth: Payer: Self-pay | Admitting: Neurology

## 2020-03-11 NOTE — Telephone Encounter (Signed)
Patient called and LVM stating that she wanted to reschedule her Botox appointment from 9/22. I called [facility] and spoke with Shelda to see if patient's Botox could be delivered to our office. Janan Halter states the medication will be delivered 10/28. Dr. Cathren Laine Botox schedule is booked until 11/23. Is this okay to offer patient, or should she see NP?

## 2020-03-11 NOTE — Telephone Encounter (Signed)
She can go to NP thanks

## 2020-03-13 NOTE — Telephone Encounter (Signed)
(  2) 100U Botox vials delivered today from [facility].

## 2020-03-17 ENCOUNTER — Ambulatory Visit (INDEPENDENT_AMBULATORY_CARE_PROVIDER_SITE_OTHER): Payer: BC Managed Care – PPO | Admitting: Family Medicine

## 2020-03-17 ENCOUNTER — Other Ambulatory Visit: Payer: Self-pay

## 2020-03-17 DIAGNOSIS — G43711 Chronic migraine without aura, intractable, with status migrainosus: Secondary | ICD-10-CM

## 2020-03-17 NOTE — Progress Notes (Signed)
Botox-100unitsx2 vials Lot: [id number] Expiration: 06/2022 NDC: 1017-5102-58   0.9% Sodium Chloride- 40mL total Lot: [id number] Expiration: 02/2022 NDC: 23536-144-31  Dx: Chronic Migraine SP  Consent signed

## 2020-03-17 NOTE — Progress Notes (Signed)
She continues Amovig every 30 days, verapamil (rx by PCP) and rizatriptan for abortive therapy. She has had worsening headaches since last being seen. She has had some difficulty getting Botox delivered. She has had to use rizatriptan more frequently, recently. She was treated for sinus infection last week with abx and cough syrup. She is feeling a little better now. She has noted an unusual sensation of the left base of neck and head over the past month of being off Botox.     Consent Form Botulism Toxin Injection For Chronic Migraine    Reviewed orally with patient, additionally signature is on file:  Botulism toxin has been approved by the Federal drug administration for treatment of chronic migraine. Botulism toxin does not cure chronic migraine and it may not be effective in some patients.  The administration of botulism toxin is accomplished by injecting a small amount of toxin into the muscles of the neck and head. Dosage must be titrated for each individual. Any benefits resulting from botulism toxin tend to wear off after 3 months with a repeat injection required if benefit is to be maintained. Injections are usually done every 3-4 months with maximum effect peak achieved by about 2 or 3 weeks. Botulism toxin is expensive and you should be sure of what costs you will incur resulting from the injection.  The side effects of botulism toxin use for chronic migraine may include:   -Transient, and usually mild, facial weakness with facial injections  -Transient, and usually mild, head or neck weakness with head/neck injections  -Reduction or loss of forehead facial animation due to forehead muscle weakness  -Eyelid drooping  -Dry eye  -Pain at the site of injection or bruising at the site of injection  -Double vision  -Potential unknown long term risks   Contraindications: You should not have Botox if you are pregnant, nursing, allergic to albumin, have an infection, skin condition,  or muscle weakness at the site of the injection, or have myasthenia gravis, Lambert-Eaton syndrome, or ALS.  It is also possible that as with any injection, there may be an allergic reaction or no effect from the medication. Reduced effectiveness after repeated injections is sometimes seen and rarely infection at the injection site may occur. All care will be taken to prevent these side effects. If therapy is given over a long time, atrophy and wasting in the muscle injected may occur. Occasionally the patient's become refractory to treatment because they develop antibodies to the toxin. In this event, therapy needs to be modified.  I have read the above information and consent to the administration of botulism toxin.    BOTOX PROCEDURE NOTE FOR MIGRAINE HEADACHE  Contraindications and precautions discussed with patient(above). Aseptic procedure was observed and patient tolerated procedure. Procedure performed by Debbora Presto, FNP-C.   The condition has existed for more than 6 months, and pt does not have a diagnosis of ALS, Myasthenia Gravis or Lambert-Eaton Syndrome.  Risks and benefits of injections discussed and pt agrees to proceed with the procedure.  Written consent obtained  These injections are medically necessary. Pt  receives good benefits from these injections. These injections do not cause sedations or hallucinations which the oral therapies may cause.   Description of procedure:  The patient was placed in a sitting position. The standard protocol was used for Botox as follows, with 5 units of Botox injected at each site:  -Procerus muscle, midline injection  -Corrugator muscle, bilateral injection  -Frontalis muscle, bilateral injection,  with 2 sites each side, medial injection was performed in the upper one third of the frontalis muscle, in the region vertical from the medial inferior edge of the superior orbital rim. The lateral injection was again in the upper one third of the  forehead vertically above the lateral limbus of the cornea, 1.5 cm lateral to the medial injection site.  -Temporalis muscle injection, 4 sites, bilaterally. The first injection was 3 cm above the tragus of the ear, second injection site was 1.5 cm to 3 cm up from the first injection site in line with the tragus of the ear. The third injection site was 1.5-3 cm forward between the first 2 injection sites. The fourth injection site was 1.5 cm posterior to the second injection site. 5th site laterally in the temporalis  muscleat the level of the outer canthus.  -Occipitalis muscle injection, 3 sites, bilaterally. The first injection was done one half way between the occipital protuberance and the tip of the mastoid process behind the ear. The second injection site was done lateral and superior to the first, 1 fingerbreadth from the first injection. The third injection site was 1 fingerbreadth superiorly and medially from the first injection site.  -Cervical paraspinal muscle injection, 2 sites, bilaterally. The first injection site was 1 cm from the midline of the cervical spine, 3 cm inferior to the lower border of the occipital protuberance. The second injection site was 1.5 cm superiorly and laterally to the first injection site.  -Trapezius muscle injection was performed at 3 sites, bilaterally. The first injection site was in the upper trapezius muscle halfway between the inflection point of the neck, and the acromion. The second injection site was one half way between the acromion and the first injection site. The third injection was done between the first injection site and the inflection point of the neck.  -masseter muscle: injected bilaterally 5 units   Will return for repeat injection in 3 months.   A total of 200 units of Botox was prepared, 165 units of Botox was injected as documented above, any Botox not injected was wasted. The patient tolerated the procedure well, there were no  complications of the above procedure.

## 2020-05-21 ENCOUNTER — Encounter: Payer: Self-pay | Admitting: Family Medicine

## 2020-06-09 ENCOUNTER — Encounter: Payer: Self-pay | Admitting: Family Medicine

## 2020-06-10 NOTE — Telephone Encounter (Signed)
I returned patient's call. She recently tested positive again on the 23rd. Patient would like to push appt out further. I rescheduled her for Amy's next available 2/23 at 3:30.

## 2020-06-18 ENCOUNTER — Ambulatory Visit: Payer: BC Managed Care – PPO | Admitting: Family Medicine

## 2020-07-09 ENCOUNTER — Ambulatory Visit: Payer: BC Managed Care – PPO | Admitting: Family Medicine

## 2020-07-09 DIAGNOSIS — G43711 Chronic migraine without aura, intractable, with status migrainosus: Secondary | ICD-10-CM | POA: Diagnosis not present

## 2020-07-09 MED ORDER — ERENUMAB-AOOE 140 MG/ML ~~LOC~~ SOAJ: 140.0000 mg | SUBCUTANEOUS | 3 refills | Status: DC

## 2020-07-09 MED ORDER — ERENUMAB-AOOE 140 MG/ML ~~LOC~~ SOAJ: 140.0000 mg | SUBCUTANEOUS | 0 refills | Status: DC

## 2020-07-09 NOTE — Progress Notes (Addendum)
07/09/2020 ALL: She continues to do well. She did have more headaches with recent diagnosis of Covid and strep throat. Botox does help. Amovig and verapamil also helps. Insurance will not cover both. She was getting medication using copay card. Rizatriptan helps with abortive therapy. She has started PT for acute torticollis. She does feel masseter injections help with clenching.  *Amovig sample (4) given with copay card    03/17/2020 ALL: She continues Amovig every 30 days, verapamil (rx by PCP) and rizatriptan for abortive therapy. She has had worsening headaches since last being seen. She has had some difficulty getting Botox delivered. She has had to use rizatriptan more frequently, recently. She was treated for sinus infection last week with abx and cough syrup. She is feeling a little better now. She has noted an unusual sensation of the left base of neck and head over the past month of being off Botox.     Consent Form Botulism Toxin Injection For Chronic Migraine    Reviewed orally with patient, additionally signature is on file:  Botulism toxin has been approved by the Federal drug administration for treatment of chronic migraine. Botulism toxin does not cure chronic migraine and it may not be effective in some patients.  The administration of botulism toxin is accomplished by injecting a small amount of toxin into the muscles of the neck and head. Dosage must be titrated for each individual. Any benefits resulting from botulism toxin tend to wear off after 3 months with a repeat injection required if benefit is to be maintained. Injections are usually done every 3-4 months with maximum effect peak achieved by about 2 or 3 weeks. Botulism toxin is expensive and you should be sure of what costs you will incur resulting from the injection.  The side effects of botulism toxin use for chronic migraine may include:   -Transient, and usually mild, facial weakness with facial  injections  -Transient, and usually mild, head or neck weakness with head/neck injections  -Reduction or loss of forehead facial animation due to forehead muscle weakness  -Eyelid drooping  -Dry eye  -Pain at the site of injection or bruising at the site of injection  -Double vision  -Potential unknown long term risks   Contraindications: You should not have Botox if you are pregnant, nursing, allergic to albumin, have an infection, skin condition, or muscle weakness at the site of the injection, or have myasthenia gravis, Lambert-Eaton syndrome, or ALS.  It is also possible that as with any injection, there may be an allergic reaction or no effect from the medication. Reduced effectiveness after repeated injections is sometimes seen and rarely infection at the injection site may occur. All care will be taken to prevent these side effects. If therapy is given over a long time, atrophy and wasting in the muscle injected may occur. Occasionally the patient's become refractory to treatment because they develop antibodies to the toxin. In this event, therapy needs to be modified.  I have read the above information and consent to the administration of botulism toxin.    BOTOX PROCEDURE NOTE FOR MIGRAINE HEADACHE  Contraindications and precautions discussed with patient(above). Aseptic procedure was observed and patient tolerated procedure. Procedure performed by Debbora Presto, FNP-C.   The condition has existed for more than 6 months, and pt does not have a diagnosis of ALS, Myasthenia Gravis or Lambert-Eaton Syndrome.  Risks and benefits of injections discussed and pt agrees to proceed with the procedure.  Written consent obtained  These  injections are medically necessary. Pt  receives good benefits from these injections. These injections do not cause sedations or hallucinations which the oral therapies may cause.   Description of procedure:  The patient was placed in a sitting position. The  standard protocol was used for Botox as follows, with 5 units of Botox injected at each site:  -Procerus muscle, midline injection  -Corrugator muscle, bilateral injection  -Frontalis muscle, bilateral injection, with 2 sites each side, medial injection was performed in the upper one third of the frontalis muscle, in the region vertical from the medial inferior edge of the superior orbital rim. The lateral injection was again in the upper one third of the forehead vertically above the lateral limbus of the cornea, 1.5 cm lateral to the medial injection site.  -Temporalis muscle injection, 4 sites, bilaterally. The first injection was 3 cm above the tragus of the ear, second injection site was 1.5 cm to 3 cm up from the first injection site in line with the tragus of the ear. The third injection site was 1.5-3 cm forward between the first 2 injection sites. The fourth injection site was 1.5 cm posterior to the second injection site. 5th site laterally in the temporalis  muscleat the level of the outer canthus.  -Occipitalis muscle injection, 3 sites, bilaterally. The first injection was done one half way between the occipital protuberance and the tip of the mastoid process behind the ear. The second injection site was done lateral and superior to the first, 1 fingerbreadth from the first injection. The third injection site was 1 fingerbreadth superiorly and medially from the first injection site.  -Cervical paraspinal muscle injection, 2 sites, bilaterally. The first injection site was 1 cm from the midline of the cervical spine, 3 cm inferior to the lower border of the occipital protuberance. The second injection site was 1.5 cm superiorly and laterally to the first injection site.  -Trapezius muscle injection was performed at 3 sites, bilaterally. The first injection site was in the upper trapezius muscle halfway between the inflection point of the neck, and the acromion. The second injection site was  one half way between the acromion and the first injection site. The third injection was done between the first injection site and the inflection point of the neck.  -masseter muscle: injected bilaterally 5 units   Will return for repeat injection in 3 months.   A total of 200 units of Botox was prepared, 165 units of Botox was injected as documented above, any Botox not injected was wasted. The patient tolerated the procedure well, there were no complications of the above procedure.  Made any corrections needed, and agree with history, physical, neuro exam,assessment and plan as stated.     Sarina Ill, MD [facility]

## 2020-07-09 NOTE — Progress Notes (Signed)
Botox- 100 units x 2 vial Lot: [id number] Expiration: 2/24 NDC: 7322-5672-09  Bacteriostatic 0.9% Sodium Chloride- 68mL total [id number] Expiration: 2/23 [id number]   Dx: B30.104  SP

## 2020-08-04 MED ORDER — METOCLOPRAMIDE HCL 10 MG PO TABS: 10.0000 mg | ORAL_TABLET | Freq: Three times a day (TID) | ORAL | 3 refills | Status: DC

## 2020-08-04 NOTE — Addendum Note (Signed)
Addended byUbaldo Glassing, Bashar Milam L on: 08/04/2020 12:22 PM   Modules accepted: Orders

## 2020-08-06 ENCOUNTER — Telehealth: Payer: Self-pay | Admitting: *Deleted

## 2020-08-06 NOTE — Telephone Encounter (Signed)
Patient's insurance does prefer Ajovy and Emgality first however it appears her insurance will not approve both Botox and CGRP at the same time.

## 2020-08-06 NOTE — Telephone Encounter (Signed)
Completed Aimovig PA on cover my meds. Key: [id number].  Anticipate this will get denied because insurance requires trial of Ajovy and Emgality.  Patient was recently given a few samples.  Will update when we have a determination.  The savings card likely will not work if [employer] does not cover.

## 2020-08-06 NOTE — Telephone Encounter (Signed)
Per [facility], Aimovig denied and the preferred medications are Ajovy and Emgality. If an appeal is desired, fax to [phone].

## 2020-08-10 ENCOUNTER — Other Ambulatory Visit: Payer: Self-pay | Admitting: Neurology

## 2020-09-16 NOTE — Telephone Encounter (Signed)
I would complete as we did last year. I have not heard back from patient. TY!

## 2020-09-16 NOTE — Telephone Encounter (Signed)
Noted, completed [employer] PA form for Botox. I will fax to [place] closer to the current PA's expiration date (5/16).

## 2020-09-23 NOTE — Telephone Encounter (Signed)
Faxed PA to [facility] with notes.

## 2020-09-25 NOTE — Telephone Encounter (Signed)
Received approval for Botox from [place]. Reference #[id number] (09/23/20- 09/22/21).

## 2020-10-09 NOTE — Telephone Encounter (Signed)
Patient has a Botox appointment 6/2. I called [facility] to schedule Botox delivery & I spoke with [facility], however they need patient's verbal consent for shipment. Chelsea states she reached out to patient and LVM asking her to call the pharmacy.

## 2020-10-14 NOTE — Progress Notes (Addendum)
10/16/2020 ALL: She is doing well, today. She was able to get Amovig with copay card. Botox continues to be helpful. She also continues verapamil.  She has had occasional headaches but only 1 severe migraine over the past 3 months. Rizatriptan works well for abortive therapy. She takes it 2-3 times per month, on average. She requests to continue masseter injections.    07/09/2020 ALL: She continues to do well. She did have more headaches with recent diagnosis of Covid and strep throat. Botox does help. Amovig and verapamil also helps. Insurance will not cover both. She was getting medication using copay card. Rizatriptan helps with abortive therapy. She has started PT for acute torticollis. She does feel masseter injections help with clenching.  *Amovig sample (4) given with copay card  03/17/2020 ALL: She continues Amovig every 30 days, verapamil (rx by PCP) and rizatriptan for abortive therapy. She has had worsening headaches since last being seen. She has had some difficulty getting Botox delivered. She has had to use rizatriptan more frequently, recently. She was treated for sinus infection last week with abx and cough syrup. She is feeling a little better now. She has noted an unusual sensation of the left base of neck and head over the past month of being off Botox.     Consent Form Botulism Toxin Injection For Chronic Migraine    Reviewed orally with patient, additionally signature is on file:  Botulism toxin has been approved by the Federal drug administration for treatment of chronic migraine. Botulism toxin does not cure chronic migraine and it may not be effective in some patients.  The administration of botulism toxin is accomplished by injecting a small amount of toxin into the muscles of the neck and head. Dosage must be titrated for each individual. Any benefits resulting from botulism toxin tend to wear off after 3 months with a repeat injection required if benefit is to be  maintained. Injections are usually done every 3-4 months with maximum effect peak achieved by about 2 or 3 weeks. Botulism toxin is expensive and you should be sure of what costs you will incur resulting from the injection.  The side effects of botulism toxin use for chronic migraine may include:   -Transient, and usually mild, facial weakness with facial injections  -Transient, and usually mild, head or neck weakness with head/neck injections  -Reduction or loss of forehead facial animation due to forehead muscle weakness  -Eyelid drooping  -Dry eye  -Pain at the site of injection or bruising at the site of injection  -Double vision  -Potential unknown long term risks   Contraindications: You should not have Botox if you are pregnant, nursing, allergic to albumin, have an infection, skin condition, or muscle weakness at the site of the injection, or have myasthenia gravis, Lambert-Eaton syndrome, or ALS.  It is also possible that as with any injection, there may be an allergic reaction or no effect from the medication. Reduced effectiveness after repeated injections is sometimes seen and rarely infection at the injection site may occur. All care will be taken to prevent these side effects. If therapy is given over a long time, atrophy and wasting in the muscle injected may occur. Occasionally the patient's become refractory to treatment because they develop antibodies to the toxin. In this event, therapy needs to be modified.  I have read the above information and consent to the administration of botulism toxin.    BOTOX PROCEDURE NOTE FOR MIGRAINE HEADACHE  Contraindications and precautions  discussed with patient(above). Aseptic procedure was observed and patient tolerated procedure. Procedure performed by Debbora Presto, FNP-C.   The condition has existed for more than 6 months, and pt does not have a diagnosis of ALS, Myasthenia Gravis or Lambert-Eaton Syndrome.  Risks and benefits of  injections discussed and pt agrees to proceed with the procedure.  Written consent obtained  These injections are medically necessary. Pt  receives good benefits from these injections. These injections do not cause sedations or hallucinations which the oral therapies may cause.   Description of procedure:  The patient was placed in a sitting position. The standard protocol was used for Botox as follows, with 5 units of Botox injected at each site:  -Procerus muscle, midline injection  -Corrugator muscle, bilateral injection  -Frontalis muscle, bilateral injection, with 2 sites each side, medial injection was performed in the upper one third of the frontalis muscle, in the region vertical from the medial inferior edge of the superior orbital rim. The lateral injection was again in the upper one third of the forehead vertically above the lateral limbus of the cornea, 1.5 cm lateral to the medial injection site.  -Temporalis muscle injection, 4 sites, bilaterally. The first injection was 3 cm above the tragus of the ear, second injection site was 1.5 cm to 3 cm up from the first injection site in line with the tragus of the ear. The third injection site was 1.5-3 cm forward between the first 2 injection sites. The fourth injection site was 1.5 cm posterior to the second injection site. 5th site laterally in the temporalis  muscleat the level of the outer canthus.  -Occipitalis muscle injection, 3 sites, bilaterally. The first injection was done one half way between the occipital protuberance and the tip of the mastoid process behind the ear. The second injection site was done lateral and superior to the first, 1 fingerbreadth from the first injection. The third injection site was 1 fingerbreadth superiorly and medially from the first injection site.  -Cervical paraspinal muscle injection, 2 sites, bilaterally. The first injection site was 1 cm from the midline of the cervical spine, 3 cm inferior to  the lower border of the occipital protuberance. The second injection site was 1.5 cm superiorly and laterally to the first injection site.  -Trapezius muscle injection was performed at 3 sites, bilaterally. The first injection site was in the upper trapezius muscle halfway between the inflection point of the neck, and the acromion. The second injection site was one half way between the acromion and the first injection site. The third injection was done between the first injection site and the inflection point of the neck.  -masseter muscle: injected bilaterally 5 units   Will return for repeat injection in 3 months.   A total of 200 units of Botox was prepared, 165 units of Botox was injected as documented above, any Botox not injected was wasted. The patient tolerated the procedure well, there were no complications of the above procedure.   Agree with procedure Sarina Ill, MD

## 2020-10-15 NOTE — Telephone Encounter (Signed)
Received (2) 100 unit vials of Botox today from [place].

## 2020-10-16 ENCOUNTER — Ambulatory Visit: Payer: BC Managed Care – PPO | Admitting: Family Medicine

## 2020-10-16 ENCOUNTER — Encounter: Payer: Self-pay | Admitting: Family Medicine

## 2020-10-16 DIAGNOSIS — G43711 Chronic migraine without aura, intractable, with status migrainosus: Secondary | ICD-10-CM

## 2020-10-16 NOTE — Progress Notes (Signed)
Botox- 100 units x 2 vials Lot: [id number] Expiration: 01/2022 NDC: 4665-9935-70  Bacteriostatic 0.9% Sodium Chloride- 35mL total Lot: [id number] Expiration: 10/15/2021 NDC: 0300-9233-00  Dx: T62.263 S/P

## 2021-01-13 NOTE — Progress Notes (Signed)
01/14/2021: She continues Amovig and verapamil. Botox is very helpful. Rizatriptan continues to be effective for abortive therapy. She has had a few more headaches with the weather changes but feels they are mild. She has lost 30 pounds on Optavia. Masseter injections are beneficial to reduce clenching.   10/16/2020 ALL: She is doing well, today. She was able to get Amovig with copay card. Botox continues to be helpful. She also continues verapamil.  She has had occasional headaches but only 1 severe migraine over the past 3 months. Rizatriptan works well for abortive therapy. She takes it 2-3 times per month, on average. She requests to continue masseter injections.   07/09/2020 ALL: She continues to do well. She did have more headaches with recent diagnosis of Covid and strep throat. Botox does help. Amovig and verapamil also helps. Insurance will not cover both. She was getting medication using copay card. Rizatriptan helps with abortive therapy. She has started PT for acute torticollis. She does feel masseter injections help with clenching.  *Amovig sample (4) given with copay card  03/17/2020 ALL: She continues Amovig every 30 days, verapamil (rx by PCP) and rizatriptan for abortive therapy. She has had worsening headaches since last being seen. She has had some difficulty getting Botox delivered. She has had to use rizatriptan more frequently, recently. She was treated for sinus infection last week with abx and cough syrup. She is feeling a little better now. She has noted an unusual sensation of the left base of neck and head over the past month of being off Botox.     Consent Form Botulism Toxin Injection For Chronic Migraine    Reviewed orally with patient, additionally signature is on file:  Botulism toxin has been approved by the Federal drug administration for treatment of chronic migraine. Botulism toxin does not cure chronic migraine and it may not be effective in some patients.  The  administration of botulism toxin is accomplished by injecting a small amount of toxin into the muscles of the neck and head. Dosage must be titrated for each individual. Any benefits resulting from botulism toxin tend to wear off after 3 months with a repeat injection required if benefit is to be maintained. Injections are usually done every 3-4 months with maximum effect peak achieved by about 2 or 3 weeks. Botulism toxin is expensive and you should be sure of what costs you will incur resulting from the injection.  The side effects of botulism toxin use for chronic migraine may include:   -Transient, and usually mild, facial weakness with facial injections  -Transient, and usually mild, head or neck weakness with head/neck injections  -Reduction or loss of forehead facial animation due to forehead muscle weakness  -Eyelid drooping  -Dry eye  -Pain at the site of injection or bruising at the site of injection  -Double vision  -Potential unknown long term risks   Contraindications: You should not have Botox if you are pregnant, nursing, allergic to albumin, have an infection, skin condition, or muscle weakness at the site of the injection, or have myasthenia gravis, Lambert-Eaton syndrome, or ALS.  It is also possible that as with any injection, there may be an allergic reaction or no effect from the medication. Reduced effectiveness after repeated injections is sometimes seen and rarely infection at the injection site may occur. All care will be taken to prevent these side effects. If therapy is given over a long time, atrophy and wasting in the muscle injected may occur. Occasionally  the patient's become refractory to treatment because they develop antibodies to the toxin. In this event, therapy needs to be modified.  I have read the above information and consent to the administration of botulism toxin.    BOTOX PROCEDURE NOTE FOR MIGRAINE HEADACHE  Contraindications and precautions  discussed with patient(above). Aseptic procedure was observed and patient tolerated procedure. Procedure performed by Debbora Presto, FNP-C.   The condition has existed for more than 6 months, and pt does not have a diagnosis of ALS, Myasthenia Gravis or Lambert-Eaton Syndrome.  Risks and benefits of injections discussed and pt agrees to proceed with the procedure.  Written consent obtained  These injections are medically necessary. Pt  receives good benefits from these injections. These injections do not cause sedations or hallucinations which the oral therapies may cause.   Description of procedure:  The patient was placed in a sitting position. The standard protocol was used for Botox as follows, with 5 units of Botox injected at each site:  -Procerus muscle, midline injection  -Corrugator muscle, bilateral injection  -Frontalis muscle, bilateral injection, with 2 sites each side, medial injection was performed in the upper one third of the frontalis muscle, in the region vertical from the medial inferior edge of the superior orbital rim. The lateral injection was again in the upper one third of the forehead vertically above the lateral limbus of the cornea, 1.5 cm lateral to the medial injection site.  -Temporalis muscle injection, 4 sites, bilaterally. The first injection was 3 cm above the tragus of the ear, second injection site was 1.5 cm to 3 cm up from the first injection site in line with the tragus of the ear. The third injection site was 1.5-3 cm forward between the first 2 injection sites. The fourth injection site was 1.5 cm posterior to the second injection site. 5th site laterally in the temporalis  muscleat the level of the outer canthus.  -Occipitalis muscle injection, 3 sites, bilaterally. The first injection was done one half way between the occipital protuberance and the tip of the mastoid process behind the ear. The second injection site was done lateral and superior to the  first, 1 fingerbreadth from the first injection. The third injection site was 1 fingerbreadth superiorly and medially from the first injection site.  -Cervical paraspinal muscle injection, 2 sites, bilaterally. The first injection site was 1 cm from the midline of the cervical spine, 3 cm inferior to the lower border of the occipital protuberance. The second injection site was 1.5 cm superiorly and laterally to the first injection site.  -Trapezius muscle injection was performed at 3 sites, bilaterally. The first injection site was in the upper trapezius muscle halfway between the inflection point of the neck, and the acromion. The second injection site was one half way between the acromion and the first injection site. The third injection was done between the first injection site and the inflection point of the neck.  -masseter muscle: injected bilaterally 5 units   Will return for repeat injection in 3 months.   A total of 200 units of Botox was prepared, 165 units of Botox was injected as documented above, any Botox not injected was wasted. The patient tolerated the procedure well, there were no complications of the above procedure.   Agree with procedure Sarina Ill, MD

## 2021-01-14 ENCOUNTER — Other Ambulatory Visit: Payer: Self-pay

## 2021-01-14 ENCOUNTER — Ambulatory Visit: Payer: BC Managed Care – PPO | Admitting: Family Medicine

## 2021-01-14 DIAGNOSIS — G43711 Chronic migraine without aura, intractable, with status migrainosus: Secondary | ICD-10-CM | POA: Diagnosis not present

## 2021-01-14 NOTE — Progress Notes (Signed)
Botox- 200 units x 2 vials Lot: [id number] Expiration: 12/2022 NDC: [id number]   Bacteriostatic 0.9% Sodium Chloride- 49m total [id number] Expiration: 12/23 NDC: [id number]   SP

## 2021-03-10 ENCOUNTER — Telehealth: Payer: Self-pay | Admitting: *Deleted

## 2021-03-10 NOTE — Telephone Encounter (Signed)
Submitted PA Aimovig on CMM. Key: [id number]. Waiting on determination from [facility].

## 2021-03-11 NOTE — Telephone Encounter (Signed)
PA denied. Pt must try/fail: Ajovy and Emgality. She can continue to use copay card to fill rx.

## 2021-03-12 ENCOUNTER — Other Ambulatory Visit: Payer: Self-pay | Admitting: Neurology

## 2021-03-15 ENCOUNTER — Other Ambulatory Visit: Payer: Self-pay | Admitting: Neurology

## 2021-04-20 NOTE — Progress Notes (Signed)
04/21/2021 ALL: She returns for Botox. She continues Amovig and verapamil. Rizatriptan helps with abortive therapy. She reports that she was not able to pick up Amovig rx from pharmacist this month. She is not sure what has changed but has been able to get Amovig using copay card since 07/2020. She was told she could use card through 07/2021. Insurance prefers Clinical cytogeneticist but will not approve Botox and CGRP. I have provided 2 sample pens, today. She was encouraged to contact [employer] to see if she can renew copay card and to speak with [facility] to make sure card was entered correctly. She will call me to update asap.   01/14/2021: She continues Amovig and verapamil. Botox is very helpful. Rizatriptan continues to be effective for abortive therapy. She has had a few more headaches with the weather changes but feels they are mild. She has lost 30 pounds on Optavia. Masseter injections are beneficial to reduce clenching.   10/16/2020 ALL: She is doing well, today. She was able to get Amovig with copay card. Botox continues to be helpful. She also continues verapamil.  She has had occasional headaches but only 1 severe migraine over the past 3 months. Rizatriptan works well for abortive therapy. She takes it 2-3 times per month, on average. She requests to continue masseter injections.   07/09/2020 ALL: She continues to do well. She did have more headaches with recent diagnosis of Covid and strep throat. Botox does help. Amovig and verapamil also helps. Insurance will not cover both. She was getting medication using copay card. Rizatriptan helps with abortive therapy. She has started PT for acute torticollis. She does feel masseter injections help with clenching.  *Amovig sample (4) given with copay card  03/17/2020 ALL: She continues Amovig every 30 days, verapamil (rx by PCP) and rizatriptan for abortive therapy. She has had worsening headaches since last being seen. She has had some difficulty getting  Botox delivered. She has had to use rizatriptan more frequently, recently. She was treated for sinus infection last week with abx and cough syrup. She is feeling a little better now. She has noted an unusual sensation of the left base of neck and head over the past month of being off Botox.     Consent Form Botulism Toxin Injection For Chronic Migraine    Reviewed orally with patient, additionally signature is on file:  Botulism toxin has been approved by the Federal drug administration for treatment of chronic migraine. Botulism toxin does not cure chronic migraine and it may not be effective in some patients.  The administration of botulism toxin is accomplished by injecting a small amount of toxin into the muscles of the neck and head. Dosage must be titrated for each individual. Any benefits resulting from botulism toxin tend to wear off after 3 months with a repeat injection required if benefit is to be maintained. Injections are usually done every 3-4 months with maximum effect peak achieved by about 2 or 3 weeks. Botulism toxin is expensive and you should be sure of what costs you will incur resulting from the injection.  The side effects of botulism toxin use for chronic migraine may include:   -Transient, and usually mild, facial weakness with facial injections  -Transient, and usually mild, head or neck weakness with head/neck injections  -Reduction or loss of forehead facial animation due to forehead muscle weakness  -Eyelid drooping  -Dry eye  -Pain at the site of injection or bruising at the site of  injection  -Double vision  -Potential unknown long term risks   Contraindications: You should not have Botox if you are pregnant, nursing, allergic to albumin, have an infection, skin condition, or muscle weakness at the site of the injection, or have myasthenia gravis, Lambert-Eaton syndrome, or ALS.  It is also possible that as with any injection, there may be an allergic  reaction or no effect from the medication. Reduced effectiveness after repeated injections is sometimes seen and rarely infection at the injection site may occur. All care will be taken to prevent these side effects. If therapy is given over a long time, atrophy and wasting in the muscle injected may occur. Occasionally the patient's become refractory to treatment because they develop antibodies to the toxin. In this event, therapy needs to be modified.  I have read the above information and consent to the administration of botulism toxin.    BOTOX PROCEDURE NOTE FOR MIGRAINE HEADACHE  Contraindications and precautions discussed with patient(above). Aseptic procedure was observed and patient tolerated procedure. Procedure performed by Debbora Presto, FNP-C.   The condition has existed for more than 6 months, and pt does not have a diagnosis of ALS, Myasthenia Gravis or Lambert-Eaton Syndrome.  Risks and benefits of injections discussed and pt agrees to proceed with the procedure.  Written consent obtained  These injections are medically necessary. Pt  receives good benefits from these injections. These injections do not cause sedations or hallucinations which the oral therapies may cause.   Description of procedure:  The patient was placed in a sitting position. The standard protocol was used for Botox as follows, with 5 units of Botox injected at each site:  -Procerus muscle, midline injection  -Corrugator muscle, bilateral injection  -Frontalis muscle, bilateral injection, with 2 sites each side, medial injection was performed in the upper one third of the frontalis muscle, in the region vertical from the medial inferior edge of the superior orbital rim. The lateral injection was again in the upper one third of the forehead vertically above the lateral limbus of the cornea, 1.5 cm lateral to the medial injection site.  -Temporalis muscle injection, 4 sites, bilaterally. The first injection was  3 cm above the tragus of the ear, second injection site was 1.5 cm to 3 cm up from the first injection site in line with the tragus of the ear. The third injection site was 1.5-3 cm forward between the first 2 injection sites. The fourth injection site was 1.5 cm posterior to the second injection site. 5th site laterally in the temporalis  muscleat the level of the outer canthus.  -Occipitalis muscle injection, 3 sites, bilaterally. The first injection was done one half way between the occipital protuberance and the tip of the mastoid process behind the ear. The second injection site was done lateral and superior to the first, 1 fingerbreadth from the first injection. The third injection site was 1 fingerbreadth superiorly and medially from the first injection site.  -Cervical paraspinal muscle injection, 2 sites, bilaterally. The first injection site was 1 cm from the midline of the cervical spine, 3 cm inferior to the lower border of the occipital protuberance. The second injection site was 1.5 cm superiorly and laterally to the first injection site.  -Trapezius muscle injection was performed at 3 sites, bilaterally. The first injection site was in the upper trapezius muscle halfway between the inflection point of the neck, and the acromion. The second injection site was one half way between the acromion and the  first injection site. The third injection was done between the first injection site and the inflection point of the neck.   Will return for repeat injection in 3 months.   A total of 200 units of Botox was prepared, 155 units of Botox was injected as documented above, 45 units of Botox was wasted. The patient tolerated the procedure well, there were no complications of the above procedure.   Agree with procedure Sarina Ill, MD

## 2021-04-21 ENCOUNTER — Encounter: Payer: Self-pay | Admitting: Family Medicine

## 2021-04-21 ENCOUNTER — Ambulatory Visit: Payer: BC Managed Care – PPO | Admitting: Family Medicine

## 2021-04-21 DIAGNOSIS — G43711 Chronic migraine without aura, intractable, with status migrainosus: Secondary | ICD-10-CM | POA: Diagnosis not present

## 2021-04-21 MED ORDER — ERENUMAB-AOOE 140 MG/ML ~~LOC~~ SOAJ: 140.0000 mg | SUBCUTANEOUS | 0 refills | Status: DC

## 2021-04-21 NOTE — Progress Notes (Signed)
Botox- 100 units x 2 vials Lot: [id number] Expiration: 11/24 NDC: 7127-8718-36  0.9% Sodium Chloride- 57mL total Lot: [id number] Expiration: 12/23 NDC: 64290-379-55  Dx: O31.674 S/P

## 2021-06-02 ENCOUNTER — Telehealth: Payer: Self-pay | Admitting: *Deleted

## 2021-06-02 NOTE — Telephone Encounter (Signed)
PA approved for the patient through [facility] [employer] [place] 06/02/2021-06/02/2022

## 2021-06-02 NOTE — Telephone Encounter (Signed)
Submitted PA Aimovig on [facility]. Key: [id number]. Waiting on determination from [facility].

## 2021-06-25 ENCOUNTER — Telehealth: Payer: Self-pay | Admitting: Family Medicine

## 2021-06-25 NOTE — Telephone Encounter (Signed)
Called [facility] gave verbal consent to process Rx for pt's Botox. [facility] will reach out to patient to get verbal consent.

## 2021-07-13 NOTE — Telephone Encounter (Signed)
Received 2 100 unit vials of Botox from [place].

## 2021-07-21 ENCOUNTER — Ambulatory Visit: Payer: Medicare PPO | Admitting: Family Medicine

## 2021-07-21 DIAGNOSIS — G43711 Chronic migraine without aura, intractable, with status migrainosus: Secondary | ICD-10-CM | POA: Diagnosis not present

## 2021-07-21 NOTE — Progress Notes (Signed)
07/21/2021 ALL: She returns for Botox. She continues Amovig and verapamil. Rizatriptan continues to help with abortive therapy. No headaches since last visit. She is having trouble with severe dizziness. She reports BP was > 200/100 with PT during Epley maneuver. She has seen PCP. CT was unremarkable. 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 Meclizine not helping. She was referred to vestibular therapy and is planning to schedule soon. BP has been well managed.   04/21/2021 ALL: She returns for Botox. She continues Amovig and verapamil. Rizatriptan helps with abortive therapy. She reports that she was not able to pick up Amovig rx from pharmacist this month. She is not sure what has changed but has been able to get Amovig using copay card since 07/2020. She was told she could use card through 07/2021. Insurance prefers Clinical cytogeneticist but will not approve Botox and CGRP. I have provided 2 sample pens, today. She was encouraged to contact [employer] to see if she can renew copay card and to speak with [facility] to make sure card was entered correctly. She will call me to update asap.   01/14/2021: She continues Amovig and verapamil. Botox is very helpful. Rizatriptan continues to be effective for abortive therapy. She has had a few more headaches with the weather changes but feels they are mild. She has lost 30 pounds on Optavia. Masseter injections are beneficial to reduce clenching.   10/16/2020 ALL: She is doing well, today. She was able to get Amovig with copay card. Botox continues to be helpful. She also continues verapamil.  She has had occasional headaches but only 1 severe migraine over the past 3 months. Rizatriptan works well for abortive therapy. She takes it 2-3 times per month, on average. She requests to continue masseter injections.   07/09/2020 ALL: She continues to do well. She did have more headaches with recent diagnosis of Covid and strep throat. Botox does help. Amovig and verapamil also  helps. Insurance will not cover both. She was getting medication using copay card. Rizatriptan helps with abortive therapy. She has started PT for acute torticollis. She does feel masseter injections help with clenching.  *Amovig sample (4) given with copay card  03/17/2020 ALL: She continues Amovig every 30 days, verapamil (rx by PCP) and rizatriptan for abortive therapy. She has had worsening headaches since last being seen. She has had some difficulty getting Botox delivered. She has had to use rizatriptan more frequently, recently. She was treated for sinus infection last week with abx and cough syrup. She is feeling a little better now. She has noted an unusual sensation of the left base of neck and head over the past month of being off Botox.     Consent Form Botulism Toxin Injection For Chronic Migraine    Reviewed orally with patient, additionally signature is on file:  Botulism toxin has been approved by the Federal drug administration for treatment of chronic migraine. Botulism toxin does not cure chronic migraine and it may not be effective in some patients.  The administration of botulism toxin is accomplished by injecting a small amount of toxin into the muscles of the neck and head. Dosage must be titrated for each individual. Any benefits resulting from botulism toxin tend to wear off after 3 months with a repeat injection required if benefit is to be maintained. Injections are usually done every 3-4 months with maximum effect peak achieved by about 2 or 3 weeks. Botulism toxin is expensive and you should be sure  of what costs you will incur resulting from the injection.  The side effects of botulism toxin use for chronic migraine may include:   -Transient, and usually mild, facial weakness with facial injections  -Transient, and usually mild, head or neck weakness with head/neck injections  -Reduction or loss of forehead facial animation due to forehead muscle weakness  -Eyelid  drooping  -Dry eye  -Pain at the site of injection or bruising at the site of injection  -Double vision  -Potential unknown long term risks   Contraindications: You should not have Botox if you are pregnant, nursing, allergic to albumin, have an infection, skin condition, or muscle weakness at the site of the injection, or have myasthenia gravis, Lambert-Eaton syndrome, or ALS.  It is also possible that as with any injection, there may be an allergic reaction or no effect from the medication. Reduced effectiveness after repeated injections is sometimes seen and rarely infection at the injection site may occur. All care will be taken to prevent these side effects. If therapy is given over a long time, atrophy and wasting in the muscle injected may occur. Occasionally the patient's become refractory to treatment because they develop antibodies to the toxin. In this event, therapy needs to be modified.  I have read the above information and consent to the administration of botulism toxin.    BOTOX PROCEDURE NOTE FOR MIGRAINE HEADACHE  Contraindications and precautions discussed with patient(above). Aseptic procedure was observed and patient tolerated procedure. Procedure performed by Debbora Presto, FNP-C.   The condition has existed for more than 6 months, and pt does not have a diagnosis of ALS, Myasthenia Gravis or Lambert-Eaton Syndrome.  Risks and benefits of injections discussed and pt agrees to proceed with the procedure.  Written consent obtained  These injections are medically necessary. Pt  receives good benefits from these injections. These injections do not cause sedations or hallucinations which the oral therapies may cause.   Description of procedure:  The patient was placed in a sitting position. The standard protocol was used for Botox as follows, with 5 units of Botox injected at each site:  -Procerus muscle, midline injection  -Corrugator muscle, bilateral  injection  -Frontalis muscle, bilateral injection, with 2 sites each side, medial injection was performed in the upper one third of the frontalis muscle, in the region vertical from the medial inferior edge of the superior orbital rim. The lateral injection was again in the upper one third of the forehead vertically above the lateral limbus of the cornea, 1.5 cm lateral to the medial injection site.  -Temporalis muscle injection, 4 sites, bilaterally. The first injection was 3 cm above the tragus of the ear, second injection site was 1.5 cm to 3 cm up from the first injection site in line with the tragus of the ear. The third injection site was 1.5-3 cm forward between the first 2 injection sites. The fourth injection site was 1.5 cm posterior to the second injection site. 5th site laterally in the temporalis  muscleat the level of the outer canthus.  -Occipitalis muscle injection, 3 sites, bilaterally. The first injection was done one half way between the occipital protuberance and the tip of the mastoid process behind the ear. The second injection site was done lateral and superior to the first, 1 fingerbreadth from the first injection. The third injection site was 1 fingerbreadth superiorly and medially from the first injection site.  -Cervical paraspinal muscle injection, 2 sites, bilaterally. The first injection site was 1  cm from the midline of the cervical spine, 3 cm inferior to the lower border of the occipital protuberance. The second injection site was 1.5 cm superiorly and laterally to the first injection site.  -Trapezius muscle injection was performed at 3 sites, bilaterally. The first injection site was in the upper trapezius muscle halfway between the inflection point of the neck, and the acromion. The second injection site was one half way between the acromion and the first injection site. The third injection was done between the first injection site and the inflection point of the  neck.   Will return for repeat injection in 3 months.   A total of 200 units of Botox was prepared, 155 units of Botox was injected as documented above, 45 units of Botox was wasted. The patient tolerated the procedure well, there were no complications of the above procedure.

## 2021-08-13 NOTE — Telephone Encounter (Signed)
Faxed signed PA form with OV notes to [employer].  

## 2021-08-13 NOTE — Telephone Encounter (Signed)
Called patient, she now has [employer]. Completed [employer] Botox PA form, placed in Nurse Pod for MD signature. ?

## 2021-08-27 ENCOUNTER — Other Ambulatory Visit: Payer: Self-pay | Admitting: *Deleted

## 2021-08-27 DIAGNOSIS — G43711 Chronic migraine without aura, intractable, with status migrainosus: Secondary | ICD-10-CM

## 2021-08-27 MED ORDER — ERENUMAB-AOOE 140 MG/ML ~~LOC~~ SOAJ: 140.0000 mg | SUBCUTANEOUS | 3 refills | Status: DC

## 2021-08-30 ENCOUNTER — Encounter: Payer: Self-pay | Admitting: Family Medicine

## 2021-08-31 ENCOUNTER — Telehealth: Payer: Self-pay | Admitting: Neurology

## 2021-08-31 NOTE — Telephone Encounter (Signed)
PA completed for the pt through [employer] ?KEY: [id number] ?Will await response ?

## 2021-08-31 NOTE — Telephone Encounter (Signed)
PA approved for the pt ?PA Case: [id number], Status: Approved, Coverage Starts on: 05/17/2021 12:00:00 AM, Coverage Ends on: 05/16/2022  ?

## 2021-09-09 NOTE — Telephone Encounter (Signed)
Received approval from [place], [place] # [id number] (08/14/2021-05/16/2022). ?

## 2021-09-29 ENCOUNTER — Encounter: Payer: Self-pay | Admitting: Family Medicine

## 2021-10-19 ENCOUNTER — Ambulatory Visit: Payer: Medicare PPO | Admitting: Family Medicine

## 2021-10-19 DIAGNOSIS — G43709 Chronic migraine without aura, not intractable, without status migrainosus: Secondary | ICD-10-CM | POA: Diagnosis not present

## 2021-10-19 NOTE — Progress Notes (Signed)
Botox- 200 units x 1 vial Lot: [id number] Expiration: 04/2024 NDC: 8676-1950-93  Bacteriostatic 0.9% Sodium Chloride- 83m total Lot: [id number]: 01/16/2023 NDC: [address]-82 Dx: GN05.397B/B

## 2021-10-19 NOTE — Progress Notes (Signed)
10/19/2021 ALL: Jenna Chang returns for Botox. She continues Amovig, verapamil and rizatriptan. She is doing well. She continues to work toward weight loss goal. She is about 11 pounds from goal of 140. She may have 1-2 migraines per month. Easily aborted with Tylenol or rizatriptan.   07/21/2021 ALL: She returns for Botox. She continues Amovig and verapamil. Rizatriptan continues to help with abortive therapy. No headaches since last visit. She is having trouble with severe dizziness. She reports BP was > 200/100 with PT during Epley maneuver. She has seen PCP. CT was unremarkable. 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 Meclizine not helping. She was referred to vestibular therapy and is planning to schedule soon. BP has been well managed.   04/21/2021 ALL: She returns for Botox. She continues Amovig and verapamil. Rizatriptan helps with abortive therapy. She reports that she was not able to pick up Amovig rx from pharmacist this month. She is not sure what has changed but has been able to get Amovig using copay card since 07/2020. She was told she could use card through 07/2021. Insurance prefers Clinical cytogeneticist but will not approve Botox and CGRP. I have provided 2 sample pens, today. She was encouraged to contact [employer] to see if she can renew copay card and to speak with [facility] to make sure card was entered correctly. She will call me to update asap.   01/14/2021: She continues Amovig and verapamil. Botox is very helpful. Rizatriptan continues to be effective for abortive therapy. She has had a few more headaches with the weather changes but feels they are mild. She has lost 30 pounds on Optavia. Masseter injections are beneficial to reduce clenching.   10/16/2020 ALL: She is doing well, today. She was able to get Amovig with copay card. Botox continues to be helpful. She also continues verapamil.  She has had occasional headaches but only 1 severe migraine over the past 3 months. Rizatriptan works well  for abortive therapy. She takes it 2-3 times per month, on average. She requests to continue masseter injections.   07/09/2020 ALL: She continues to do well. She did have more headaches with recent diagnosis of Covid and strep throat. Botox does help. Amovig and verapamil also helps. Insurance will not cover both. She was getting medication using copay card. Rizatriptan helps with abortive therapy. She has started PT for acute torticollis. She does feel masseter injections help with clenching.  *Amovig sample (4) given with copay card  03/17/2020 ALL: She continues Amovig every 30 days, verapamil (rx by PCP) and rizatriptan for abortive therapy. She has had worsening headaches since last being seen. She has had some difficulty getting Botox delivered. She has had to use rizatriptan more frequently, recently. She was treated for sinus infection last week with abx and cough syrup. She is feeling a little better now. She has noted an unusual sensation of the left base of neck and head over the past month of being off Botox.     Consent Form Botulism Toxin Injection For Chronic Migraine    Reviewed orally with patient, additionally signature is on file:  Botulism toxin has been approved by the Federal drug administration for treatment of chronic migraine. Botulism toxin does not cure chronic migraine and it may not be effective in some patients.  The administration of botulism toxin is accomplished by injecting a small amount of toxin into the muscles of the neck and head. Dosage must be titrated for each individual. Any benefits resulting  from botulism toxin tend to wear off after 3 months with a repeat injection required if benefit is to be maintained. Injections are usually done every 3-4 months with maximum effect peak achieved by about 2 or 3 weeks. Botulism toxin is expensive and you should be sure of what costs you will incur resulting from the injection.  The side effects of botulism toxin use  for chronic migraine may include:   -Transient, and usually mild, facial weakness with facial injections  -Transient, and usually mild, head or neck weakness with head/neck injections  -Reduction or loss of forehead facial animation due to forehead muscle weakness  -Eyelid drooping  -Dry eye  -Pain at the site of injection or bruising at the site of injection  -Double vision  -Potential unknown long term risks   Contraindications: You should not have Botox if you are pregnant, nursing, allergic to albumin, have an infection, skin condition, or muscle weakness at the site of the injection, or have myasthenia gravis, Lambert-Eaton syndrome, or ALS.  It is also possible that as with any injection, there may be an allergic reaction or no effect from the medication. Reduced effectiveness after repeated injections is sometimes seen and rarely infection at the injection site may occur. All care will be taken to prevent these side effects. If therapy is given over a long time, atrophy and wasting in the muscle injected may occur. Occasionally the patient's become refractory to treatment because they develop antibodies to the toxin. In this event, therapy needs to be modified.  I have read the above information and consent to the administration of botulism toxin.    BOTOX PROCEDURE NOTE FOR MIGRAINE HEADACHE  Contraindications and precautions discussed with patient(above). Aseptic procedure was observed and patient tolerated procedure. Procedure performed by Debbora Presto, FNP-C.   The condition has existed for more than 6 months, and pt does not have a diagnosis of ALS, Myasthenia Gravis or Lambert-Eaton Syndrome.  Risks and benefits of injections discussed and pt agrees to proceed with the procedure.  Written consent obtained  These injections are medically necessary. Pt  receives good benefits from these injections. These injections do not cause sedations or hallucinations which the oral therapies  may cause.   Description of procedure:  The patient was placed in a sitting position. The standard protocol was used for Botox as follows, with 5 units of Botox injected at each site:  -Procerus muscle, midline injection  -Corrugator muscle, bilateral injection  -Frontalis muscle, bilateral injection, with 2 sites each side, medial injection was performed in the upper one third of the frontalis muscle, in the region vertical from the medial inferior edge of the superior orbital rim. The lateral injection was again in the upper one third of the forehead vertically above the lateral limbus of the cornea, 1.5 cm lateral to the medial injection site.  -Temporalis muscle injection, 4 sites, bilaterally. The first injection was 3 cm above the tragus of the ear, second injection site was 1.5 cm to 3 cm up from the first injection site in line with the tragus of the ear. The third injection site was 1.5-3 cm forward between the first 2 injection sites. The fourth injection site was 1.5 cm posterior to the second injection site. 5th site laterally in the temporalis  muscleat the level of the outer canthus.  -Occipitalis muscle injection, 3 sites, bilaterally. The first injection was done one half way between the occipital protuberance and the tip of the mastoid process behind the  ear. The second injection site was done lateral and superior to the first, 1 fingerbreadth from the first injection. The third injection site was 1 fingerbreadth superiorly and medially from the first injection site.  -Cervical paraspinal muscle injection, 2 sites, bilaterally. The first injection site was 1 cm from the midline of the cervical spine, 3 cm inferior to the lower border of the occipital protuberance. The second injection site was 1.5 cm superiorly and laterally to the first injection site.  -Trapezius muscle injection was performed at 3 sites, bilaterally. The first injection site was in the upper trapezius muscle  halfway between the inflection point of the neck, and the acromion. The second injection site was one half way between the acromion and the first injection site. The third injection was done between the first injection site and the inflection point of the neck.   Will return for repeat injection in 3 months.   A total of 200 units of Botox was prepared, 155 units of Botox was injected as documented above, 45 units of Botox was wasted. The patient tolerated the procedure well, there were no complications of the above procedure.

## 2021-12-03 ENCOUNTER — Encounter: Payer: Self-pay | Admitting: Family Medicine

## 2021-12-08 ENCOUNTER — Telehealth: Payer: Self-pay | Admitting: Neurology

## 2021-12-08 NOTE — Telephone Encounter (Signed)
Called [employer] back. Spoke w/ Marzetta Board. She transferred me to pharmacist, Gunner. Pt had autoinjector malfuntion. Provided VO to provide patient with replacement Aimovig '140mg'$  inj. He verbalized understanding and will process this for the pt. Nothing further needed.

## 2021-12-08 NOTE — Telephone Encounter (Signed)
Chang (Jenna) requesting verbal order for autoinjector for Jenna Chang-aooe 140 MG/ML SOAJ Reference no: [id number] Fax: [phone]

## 2022-01-04 ENCOUNTER — Encounter: Payer: Self-pay | Admitting: Family Medicine

## 2022-01-14 ENCOUNTER — Ambulatory Visit: Payer: Medicare PPO | Admitting: Family Medicine

## 2022-01-19 ENCOUNTER — Ambulatory Visit (INDEPENDENT_AMBULATORY_CARE_PROVIDER_SITE_OTHER): Payer: Medicare PPO | Admitting: Neurology

## 2022-01-19 DIAGNOSIS — G43711 Chronic migraine without aura, intractable, with status migrainosus: Secondary | ICD-10-CM

## 2022-01-19 MED ORDER — ONABOTULINUMTOXINA 200 UNITS IJ SOLR
155.0000 [IU] | Freq: Once | INTRAMUSCULAR | Status: AC
  Administered 2022-01-19: 155 [IU] via INTRAMUSCULAR

## 2022-01-19 NOTE — Progress Notes (Signed)
Botox- 200 units x 1 vial Lot: [id number] Expiration: 06/2024 NDC: 7195-9747-18  Bacteriostatic 0.9% Sodium Chloride- 40m total Lot: [id number] Expiration: 12/16/2022 NDC: [address]-25 Dx: GR49.355 B/B

## 2022-01-19 NOTE — Patient Instructions (Addendum)
01/19/2022: AA table doing great  10/19/2021 ALL: Jenna Chang returns for Botox. She continues Amovig, verapamil and rizatriptan. She is doing well. She continues to work toward weight loss goal. She is about 11 pounds from goal of 140. She may have 1-2 migraines per month. Easily aborted with Tylenol or rizatriptan.   07/21/2021 ALL: She returns for Botox. She continues Amovig and verapamil. Rizatriptan continues to help with abortive therapy. No headaches since last visit. She is having trouble with severe dizziness. She reports BP was > 200/100 with PT during Epley maneuver. She has seen PCP. CT was unremarkable. 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 Meclizine not helping. She was referred to vestibular therapy and is planning to schedule soon. BP has been well managed.   04/21/2021 ALL: She returns for Botox. She continues Amovig and verapamil. Rizatriptan helps with abortive therapy. She reports that she was not able to pick up Amovig rx from pharmacist this month. She is not sure what has changed but has been able to get Amovig using copay card since 07/2020. She was told she could use card through 07/2021. Insurance prefers Clinical cytogeneticist but will not approve Botox and CGRP. I have provided 2 sample pens, today. She was encouraged to contact [employer] to see if she can renew copay card and to speak with [facility] to make sure card was entered correctly. She will call me to update asap.   01/14/2021: She continues Amovig and verapamil. Botox is very helpful. Rizatriptan continues to be effective for abortive therapy. She has had a few more headaches with the weather changes but feels they are mild. She has lost 30 pounds on Optavia. Masseter injections are beneficial to reduce clenching.   10/16/2020 ALL: She is doing well, today. She was able to get Amovig with copay card. Botox continues to be helpful. She also continues verapamil.  She has had occasional headaches but only 1 severe migraine over the past  3 months. Rizatriptan works well for abortive therapy. She takes it 2-3 times per month, on average. She requests to continue masseter injections.   07/09/2020 ALL: She continues to do well. She did have more headaches with recent diagnosis of Covid and strep throat. Botox does help. Amovig and verapamil also helps. Insurance will not cover both. She was getting medication using copay card. Rizatriptan helps with abortive therapy. She has started PT for acute torticollis. She does feel masseter injections help with clenching.  *Amovig sample (4) given with copay card  03/17/2020 ALL: She continues Amovig every 30 days, verapamil (rx by PCP) and rizatriptan for abortive therapy. She has had worsening headaches since last being seen. She has had some difficulty getting Botox delivered. She has had to use rizatriptan more frequently, recently. She was treated for sinus infection last week with abx and cough syrup. She is feeling a little better now. She has noted an unusual sensation of the left base of neck and head over the past month of being off Botox.     Consent Form Botulism Toxin Injection For Chronic Migraine    Reviewed orally with patient, additionally signature is on file:  Botulism toxin has been approved by the Federal drug administration for treatment of chronic migraine. Botulism toxin does not cure chronic migraine and it may not be effective in some patients.  The administration of botulism toxin is accomplished by injecting a small amount of toxin into the muscles of the neck and head. Dosage must be titrated  for each individual. Any benefits resulting from botulism toxin tend to wear off after 3 months with a repeat injection required if benefit is to be maintained. Injections are usually done every 3-4 months with maximum effect peak achieved by about 2 or 3 weeks. Botulism toxin is expensive and you should be sure of what costs you will incur resulting from the injection.  The  side effects of botulism toxin use for chronic migraine may include:   -Transient, and usually mild, facial weakness with facial injections  -Transient, and usually mild, head or neck weakness with head/neck injections  -Reduction or loss of forehead facial animation due to forehead muscle weakness  -Eyelid drooping  -Dry eye  -Pain at the site of injection or bruising at the site of injection  -Double vision  -Potential unknown long term risks   Contraindications: You should not have Botox if you are pregnant, nursing, allergic to albumin, have an infection, skin condition, or muscle weakness at the site of the injection, or have myasthenia gravis, Lambert-Eaton syndrome, or ALS.  It is also possible that as with any injection, there may be an allergic reaction or no effect from the medication. Reduced effectiveness after repeated injections is sometimes seen and rarely infection at the injection site may occur. All care will be taken to prevent these side effects. If therapy is given over a long time, atrophy and wasting in the muscle injected may occur. Occasionally the patient's become refractory to treatment because they develop antibodies to the toxin. In this event, therapy needs to be modified.  I have read the above information and consent to the administration of botulism toxin.    BOTOX PROCEDURE NOTE FOR MIGRAINE HEADACHE  Contraindications and precautions discussed with patient(above). Aseptic procedure was observed and patient tolerated procedure. Procedure performed by Debbora Presto, FNP-C.   The condition has existed for more than 6 months, and pt does not have a diagnosis of ALS, Myasthenia Gravis or Lambert-Eaton Syndrome.  Risks and benefits of injections discussed and pt agrees to proceed with the procedure.  Written consent obtained  These injections are medically necessary. Pt  receives good benefits from these injections. These injections do not cause sedations or  hallucinations which the oral therapies may cause.   Description of procedure:  The patient was placed in a sitting position. The standard protocol was used for Botox as follows, with 5 units of Botox injected at each site:  -Procerus muscle, midline injection  -Corrugator muscle, bilateral injection  -Frontalis muscle, bilateral injection, with 2 sites each side, medial injection was performed in the upper one third of the frontalis muscle, in the region vertical from the medial inferior edge of the superior orbital rim. The lateral injection was again in the upper one third of the forehead vertically above the lateral limbus of the cornea, 1.5 cm lateral to the medial injection site.  -Temporalis muscle injection, 4 sites, bilaterally. The first injection was 3 cm above the tragus of the ear, second injection site was 1.5 cm to 3 cm up from the first injection site in line with the tragus of the ear. The third injection site was 1.5-3 cm forward between the first 2 injection sites. The fourth injection site was 1.5 cm posterior to the second injection site. 5th site laterally in the temporalis  muscleat the level of the outer canthus.  -Occipitalis muscle injection, 3 sites, bilaterally. The first injection was done one half way between the occipital protuberance and the tip  of the mastoid process behind the ear. The second injection site was done lateral and superior to the first, 1 fingerbreadth from the first injection. The third injection site was 1 fingerbreadth superiorly and medially from the first injection site.  -Cervical paraspinal muscle injection, 2 sites, bilaterally. The first injection site was 1 cm from the midline of the cervical spine, 3 cm inferior to the lower border of the occipital protuberance. The second injection site was 1.5 cm superiorly and laterally to the first injection site.  -Trapezius muscle injection was performed at 3 sites, bilaterally. The first injection  site was in the upper trapezius muscle halfway between the inflection point of the neck, and the acromion. The second injection site was one half way between the acromion and the first injection site. The third injection was done between the first injection site and the inflection point of the neck.   Will return for repeat injection in 3 months.   A total of 200 units of Botox was prepared, 155 units of Botox was injected as documented above, 45 units of Botox was wasted. The patient tolerated the procedure well, there were no complications of the above procedure.

## 2022-04-13 ENCOUNTER — Ambulatory Visit: Payer: Medicare PPO | Admitting: Neurology

## 2022-04-13 DIAGNOSIS — G43711 Chronic migraine without aura, intractable, with status migrainosus: Secondary | ICD-10-CM

## 2022-04-13 MED ORDER — ONABOTULINUMTOXINA 200 UNITS IJ SOLR
155.0000 [IU] | Freq: Once | INTRAMUSCULAR | Status: AC
  Administered 2022-04-13: 155 [IU] via INTRAMUSCULAR

## 2022-04-13 MED ORDER — METOCLOPRAMIDE HCL 10 MG PO TABS
10.0000 mg | ORAL_TABLET | Freq: Three times a day (TID) | ORAL | 3 refills | Status: AC
Start: 2022-04-13 — End: ?

## 2022-04-13 MED ORDER — RIZATRIPTAN BENZOATE 10 MG PO TBDP: ORAL_TABLET | ORAL | 11 refills | Status: DC

## 2022-04-13 NOTE — Progress Notes (Signed)
04/13/2022: Stable. Tried amy lomax and would not like to come back, 12 weeks with dr [name]. +a  01/19/2022: stable 10/19/2021 ALL: Jenna Chang returns for Botox. She continues Amovig, verapamil and rizatriptan. She is doing well. She continues to work toward weight loss goal. She is about 11 pounds from goal of 140. She may have 1-2 migraines per month. Easily aborted with Tylenol or rizatriptan.   07/21/2021 ALL: She returns for Botox. She continues Amovig and verapamil. Rizatriptan continues to help with abortive therapy. No headaches since last visit. She is having trouble with severe dizziness. She reports BP was > 200/100 with PT during Epley maneuver. She has seen PCP. CT was unremarkable. 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 Meclizine not helping. She was referred to vestibular therapy and is planning to schedule soon. BP has been well managed.   04/21/2021 ALL: She returns for Botox. She continues Amovig and verapamil. Rizatriptan helps with abortive therapy. She reports that she was not able to pick up Amovig rx from pharmacist this month. She is not sure what has changed but has been able to get Amovig using copay card since 07/2020. She was told she could use card through 07/2021. Insurance prefers Clinical cytogeneticist but will not approve Botox and CGRP. I have provided 2 sample pens, today. She was encouraged to contact [employer] to see if she can renew copay card and to speak with [facility] to make sure card was entered correctly. She will call me to update asap.   01/14/2021: She continues Amovig and verapamil. Botox is very helpful. Rizatriptan continues to be effective for abortive therapy. She has had a few more headaches with the weather changes but feels they are mild. She has lost 30 pounds on Optavia. Masseter injections are beneficial to reduce clenching.   10/16/2020 ALL: She is doing well, today. She was able to get Amovig with copay card. Botox continues to be helpful. She also continues  verapamil.  She has had occasional headaches but only 1 severe migraine over the past 3 months. Rizatriptan works well for abortive therapy. She takes it 2-3 times per month, on average. She requests to continue masseter injections.   07/09/2020 ALL: She continues to do well. She did have more headaches with recent diagnosis of Covid and strep throat. Botox does help. Amovig and verapamil also helps. Insurance will not cover both. She was getting medication using copay card. Rizatriptan helps with abortive therapy. She has started PT for acute torticollis. She does feel masseter injections help with clenching.  *Amovig sample (4) given with copay card  03/17/2020 ALL: She continues Amovig every 30 days, verapamil (rx by PCP) and rizatriptan for abortive therapy. She has had worsening headaches since last being seen. She has had some difficulty getting Botox delivered. She has had to use rizatriptan more frequently, recently. She was treated for sinus infection last week with abx and cough syrup. She is feeling a little better now. She has noted an unusual sensation of the left base of neck and head over the past month of being off Botox.     Consent Form Botulism Toxin Injection For Chronic Migraine    Reviewed orally with patient, additionally signature is on file:  Botulism toxin has been approved by the Federal drug administration for treatment of chronic migraine. Botulism toxin does not cure chronic migraine and it may not be effective in some patients.  The administration of botulism toxin is accomplished by injecting a small  amount of toxin into the muscles of the neck and head. Dosage must be titrated for each individual. Any benefits resulting from botulism toxin tend to wear off after 3 months with a repeat injection required if benefit is to be maintained. Injections are usually done every 3-4 months with maximum effect peak achieved by about 2 or 3 weeks. Botulism toxin is expensive and  you should be sure of what costs you will incur resulting from the injection.  The side effects of botulism toxin use for chronic migraine may include:   -Transient, and usually mild, facial weakness with facial injections  -Transient, and usually mild, head or neck weakness with head/neck injections  -Reduction or loss of forehead facial animation due to forehead muscle weakness  -Eyelid drooping  -Dry eye  -Pain at the site of injection or bruising at the site of injection  -Double vision  -Potential unknown long term risks   Contraindications: You should not have Botox if you are pregnant, nursing, allergic to albumin, have an infection, skin condition, or muscle weakness at the site of the injection, or have myasthenia gravis, Lambert-Eaton syndrome, or ALS.  It is also possible that as with any injection, there may be an allergic reaction or no effect from the medication. Reduced effectiveness after repeated injections is sometimes seen and rarely infection at the injection site may occur. All care will be taken to prevent these side effects. If therapy is given over a long time, atrophy and wasting in the muscle injected may occur. Occasionally the patient's become refractory to treatment because they develop antibodies to the toxin. In this event, therapy needs to be modified.  I have read the above information and consent to the administration of botulism toxin.    BOTOX PROCEDURE NOTE FOR MIGRAINE HEADACHE  Contraindications and precautions discussed with patient(above). Aseptic procedure was observed and patient tolerated procedure. Procedure performed by Debbora Presto, FNP-C.   The condition has existed for more than 6 months, and pt does not have a diagnosis of ALS, Myasthenia Gravis or Lambert-Eaton Syndrome.  Risks and benefits of injections discussed and pt agrees to proceed with the procedure.  Written consent obtained  These injections are medically necessary. Pt  receives  good benefits from these injections. These injections do not cause sedations or hallucinations which the oral therapies may cause.   Description of procedure:  The patient was placed in a sitting position. The standard protocol was used for Botox as follows, with 5 units of Botox injected at each site:  -Procerus muscle, midline injection  -Corrugator muscle, bilateral injection  -Frontalis muscle, bilateral injection, with 2 sites each side, medial injection was performed in the upper one third of the frontalis muscle, in the region vertical from the medial inferior edge of the superior orbital rim. The lateral injection was again in the upper one third of the forehead vertically above the lateral limbus of the cornea, 1.5 cm lateral to the medial injection site.  -Temporalis muscle injection, 4 sites, bilaterally. The first injection was 3 cm above the tragus of the ear, second injection site was 1.5 cm to 3 cm up from the first injection site in line with the tragus of the ear. The third injection site was 1.5-3 cm forward between the first 2 injection sites. The fourth injection site was 1.5 cm posterior to the second injection site. 5th site laterally in the temporalis  muscleat the level of the outer canthus.  -Occipitalis muscle injection, 3 sites, bilaterally.  The first injection was done one half way between the occipital protuberance and the tip of the mastoid process behind the ear. The second injection site was done lateral and superior to the first, 1 fingerbreadth from the first injection. The third injection site was 1 fingerbreadth superiorly and medially from the first injection site.  -Cervical paraspinal muscle injection, 2 sites, bilaterally. The first injection site was 1 cm from the midline of the cervical spine, 3 cm inferior to the lower border of the occipital protuberance. The second injection site was 1.5 cm superiorly and laterally to the first injection  site.  -Trapezius muscle injection was performed at 3 sites, bilaterally. The first injection site was in the upper trapezius muscle halfway between the inflection point of the neck, and the acromion. The second injection site was one half way between the acromion and the first injection site. The third injection was done between the first injection site and the inflection point of the neck.   Will return for repeat injection in 3 months.   A total of 200 units of Botox was prepared, 155 units of Botox was injected as documented above, 45 units of Botox was wasted. The patient tolerated the procedure well, there were no complications of the above procedure.

## 2022-04-13 NOTE — Progress Notes (Signed)
Botox- 200 units x 1 vial Lot: [id number] Expiration: 08/2024 NDC: 7011-0034-96  Bacteriostatic 0.9% Sodium Chloride- 25m total Lot: [id number]: 01/16/2023 NDC: [address]-46 Dx: [id number]/B

## 2022-06-03 ENCOUNTER — Telehealth: Payer: Self-pay | Admitting: Neurology

## 2022-06-03 NOTE — Telephone Encounter (Signed)
New BOTOX PA needed per note below.   Chronic Migraine CPT 64615   Botox J0585 Units:200  G43.711 Chronic Migraine without aura, intractable, with status migrainous  New insurance cards in media.

## 2022-06-03 NOTE — Telephone Encounter (Signed)
Pt has an upcoming botox injection scheduled for 07/06/22 and will need a new PA before appointment. Pt's insurance is changing and will have Narcissa in 2024

## 2022-06-03 NOTE — Telephone Encounter (Signed)
Called pt. She upload her new insurance card through [employer].

## 2022-06-03 NOTE — Telephone Encounter (Signed)
Please call patient and ask her to provide her 2024 insurance information to [place]. She can take a picture of the front and back of her card and send to [place] through mychart or at least for now, provide [place] with her ID number, group number, and if she has any prescription drug coverage information on the card (Rx ID, BIN, PCN, Group) we need that as well in addition to the provider phone number on the back of the card for pre-certifications.

## 2022-06-21 NOTE — Telephone Encounter (Signed)
Checking up on pt's PA, pt is scheduled for 07/06/22

## 2022-06-24 NOTE — Telephone Encounter (Signed)
Any updates on PA?

## 2022-06-25 ENCOUNTER — Other Ambulatory Visit (HOSPITAL_COMMUNITY): Payer: Self-pay

## 2022-06-25 NOTE — Telephone Encounter (Signed)
Pharmacy Patient Advocate Encounter   Received notification from [place] that prior authorization for Botox 200UNIT solution is required/requested.    PA submitted on 06/25/2022 to (ins) [employer] via [place]  Status is pending

## 2022-06-25 NOTE — Telephone Encounter (Signed)
BOTOX ONE-Benefit Verification [id number] Submitted!

## 2022-06-28 ENCOUNTER — Other Ambulatory Visit (HOSPITAL_COMMUNITY): Payer: Self-pay

## 2022-06-28 NOTE — Telephone Encounter (Signed)
Checking for update on auth, pt scheduled for 07/06/22

## 2022-06-28 NOTE — Telephone Encounter (Signed)
Pharmacy Patient Advocate Encounter  Prior Authorization for Botox 200UNIT solution has been approved.    PA# PA Case ID #: [id number] Effective dates: 08/13/2021 through 05/17/2023 [employer]

## 2022-06-28 NOTE — Telephone Encounter (Signed)
Submitted PA thru medical Scherrie [facility]lach PA FORM for medical on 06/28/2022 as an expedited request-will update.

## 2022-07-06 ENCOUNTER — Ambulatory Visit: Payer: Medicare PPO | Admitting: Neurology

## 2022-07-06 DIAGNOSIS — G43711 Chronic migraine without aura, intractable, with status migrainosus: Secondary | ICD-10-CM | POA: Diagnosis not present

## 2022-07-06 MED ORDER — ONABOTULINUMTOXINA 200 UNITS IJ SOLR
155.0000 [IU] | Freq: Once | INTRAMUSCULAR | Status: AC
  Administered 2022-07-06: 155 [IU] via INTRAMUSCULAR

## 2022-07-06 NOTE — Progress Notes (Signed)
07/06/2022: 100% improvement in migraine frequency and severity not one migriane since last botox 04/13/2022: Stable. Tried amy lomax and would not like to come back, 12 weeks with dr [name].   01/19/2022: stable 10/19/2021 ALL: Jenna Chang returns for Botox. She continues Amovig, verapamil and rizatriptan. She is doing well. She continues to work toward weight loss goal. She is about 11 pounds from goal of 140. She may have 1-2 migraines per month. Easily aborted with Tylenol or rizatriptan.   07/21/2021 ALL: She returns for Botox. She continues Amovig and verapamil. Rizatriptan continues to help with abortive therapy. No headaches since last visit. She is having trouble with severe dizziness. She reports BP was > 200/100 with PT during Epley maneuver. She has seen PCP. CT was unremarkable. 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 Meclizine not helping. She was referred to vestibular therapy and is planning to schedule soon. BP has been well managed.   04/21/2021 ALL: She returns for Botox. She continues Amovig and verapamil. Rizatriptan helps with abortive therapy. She reports that she was not able to pick up Amovig rx from pharmacist this month. She is not sure what has changed but has been able to get Amovig using copay card since 07/2020. She was told she could use card through 07/2021. Insurance prefers Clinical cytogeneticist but will not approve Botox and CGRP. I have provided 2 sample pens, today. She was encouraged to contact [employer] to see if she can renew copay card and to speak with [facility] to make sure card was entered correctly. She will call me to update asap.   01/14/2021: She continues Amovig and verapamil. Botox is very helpful. Rizatriptan continues to be effective for abortive therapy. She has had a few more headaches with the weather changes but feels they are mild. She has lost 30 pounds on Optavia. Masseter injections are beneficial to reduce clenching.   10/16/2020 ALL: She is doing well, today.  She was able to get Amovig with copay card. Botox continues to be helpful. She also continues verapamil.  She has had occasional headaches but only 1 severe migraine over the past 3 months. Rizatriptan works well for abortive therapy. She takes it 2-3 times per month, on average. She requests to continue masseter injections.   07/09/2020 ALL: She continues to do well. She did have more headaches with recent diagnosis of Covid and strep throat. Botox does help. Amovig and verapamil also helps. Insurance will not cover both. She was getting medication using copay card. Rizatriptan helps with abortive therapy. She has started PT for acute torticollis. She does feel masseter injections help with clenching.  *Amovig sample (4) given with copay card  03/17/2020 ALL: She continues Amovig every 30 days, verapamil (rx by PCP) and rizatriptan for abortive therapy. She has had worsening headaches since last being seen. She has had some difficulty getting Botox delivered. She has had to use rizatriptan more frequently, recently. She was treated for sinus infection last week with abx and cough syrup. She is feeling a little better now. She has noted an unusual sensation of the left base of neck and head over the past month of being off Botox.     Consent Form Botulism Toxin Injection For Chronic Migraine    Reviewed orally with patient, additionally signature is on file:  Botulism toxin has been approved by the Federal drug administration for treatment of chronic migraine. Botulism toxin does not cure chronic migraine and it may not be effective in  some patients.  The administration of botulism toxin is accomplished by injecting a small amount of toxin into the muscles of the neck and head. Dosage must be titrated for each individual. Any benefits resulting from botulism toxin tend to wear off after 3 months with a repeat injection required if benefit is to be maintained. Injections are usually done every 3-4  months with maximum effect peak achieved by about 2 or 3 weeks. Botulism toxin is expensive and you should be sure of what costs you will incur resulting from the injection.  The side effects of botulism toxin use for chronic migraine may include:   -Transient, and usually mild, facial weakness with facial injections  -Transient, and usually mild, head or neck weakness with head/neck injections  -Reduction or loss of forehead facial animation due to forehead muscle weakness  -Eyelid drooping  -Dry eye  -Pain at the site of injection or bruising at the site of injection  -Double vision  -Potential unknown long term risks   Contraindications: You should not have Botox if you are pregnant, nursing, allergic to albumin, have an infection, skin condition, or muscle weakness at the site of the injection, or have myasthenia gravis, Lambert-Eaton syndrome, or ALS.  It is also possible that as with any injection, there may be an allergic reaction or no effect from the medication. Reduced effectiveness after repeated injections is sometimes seen and rarely infection at the injection site may occur. All care will be taken to prevent these side effects. If therapy is given over a long time, atrophy and wasting in the muscle injected may occur. Occasionally the patient's become refractory to treatment because they develop antibodies to the toxin. In this event, therapy needs to be modified.  I have read the above information and consent to the administration of botulism toxin.    BOTOX PROCEDURE NOTE FOR MIGRAINE HEADACHE  Contraindications and precautions discussed with patient(above). Aseptic procedure was observed and patient tolerated procedure. Procedure performed by Debbora Presto, FNP-C.   The condition has existed for more than 6 months, and pt does not have a diagnosis of ALS, Myasthenia Gravis or Lambert-Eaton Syndrome.  Risks and benefits of injections discussed and pt agrees to proceed with the  procedure.  Written consent obtained  These injections are medically necessary. Pt  receives good benefits from these injections. These injections do not cause sedations or hallucinations which the oral therapies may cause.   Description of procedure:  The patient was placed in a sitting position. The standard protocol was used for Botox as follows, with 5 units of Botox injected at each site:  -Procerus muscle, midline injection  -Corrugator muscle, bilateral injection  -Frontalis muscle, bilateral injection, with 2 sites each side, medial injection was performed in the upper one third of the frontalis muscle, in the region vertical from the medial inferior edge of the superior orbital rim. The lateral injection was again in the upper one third of the forehead vertically above the lateral limbus of the cornea, 1.5 cm lateral to the medial injection site.  -Temporalis muscle injection, 4 sites, bilaterally. The first injection was 3 cm above the tragus of the ear, second injection site was 1.5 cm to 3 cm up from the first injection site in line with the tragus of the ear. The third injection site was 1.5-3 cm forward between the first 2 injection sites. The fourth injection site was 1.5 cm posterior to the second injection site. 5th site laterally in the temporalis  muscleat the level of the outer canthus.  -Occipitalis muscle injection, 3 sites, bilaterally. The first injection was done one half way between the occipital protuberance and the tip of the mastoid process behind the ear. The second injection site was done lateral and superior to the first, 1 fingerbreadth from the first injection. The third injection site was 1 fingerbreadth superiorly and medially from the first injection site.  -Cervical paraspinal muscle injection, 2 sites, bilaterally. The first injection site was 1 cm from the midline of the cervical spine, 3 cm inferior to the lower border of the occipital protuberance. The  second injection site was 1.5 cm superiorly and laterally to the first injection site.  -Trapezius muscle injection was performed at 3 sites, bilaterally. The first injection site was in the upper trapezius muscle halfway between the inflection point of the neck, and the acromion. The second injection site was one half way between the acromion and the first injection site. The third injection was done between the first injection site and the inflection point of the neck.   Will return for repeat injection in 3 months.   A total of 200 units of Botox was prepared, 155 units of Botox was injected as documented above, 45 units of Botox was wasted. The patient tolerated the procedure well, there were no complications of the above procedure.

## 2022-07-06 NOTE — Progress Notes (Signed)
Botox consent signed  Botox- 200 units x 1 vial Lot: [id number] Expiration: 10/2024 NDC: [id number]  Bacteriostatic 0.9% Sodium Chloride- 84m total Lot: [id number]: 11/25 NDC: [id number] Dx: [id number]

## 2022-09-15 ENCOUNTER — Other Ambulatory Visit: Payer: Self-pay | Admitting: *Deleted

## 2022-09-15 ENCOUNTER — Encounter: Payer: Self-pay | Admitting: Neurology

## 2022-09-15 DIAGNOSIS — G43711 Chronic migraine without aura, intractable, with status migrainosus: Secondary | ICD-10-CM

## 2022-09-15 MED ORDER — ERENUMAB-AOOE 140 MG/ML ~~LOC~~ SOAJ: 140.0000 mg | SUBCUTANEOUS | 3 refills | Status: DC

## 2022-09-28 ENCOUNTER — Ambulatory Visit: Payer: Medicare PPO | Admitting: Neurology

## 2022-09-28 DIAGNOSIS — G43711 Chronic migraine without aura, intractable, with status migrainosus: Secondary | ICD-10-CM

## 2022-09-28 MED ORDER — ONABOTULINUMTOXINA 100 UNITS IJ SOLR
155.0000 [IU] | Freq: Once | INTRAMUSCULAR | Status: AC
Start: 2022-09-28 — End: 2022-09-28
  Administered 2022-09-28: 155 [IU] via INTRAMUSCULAR

## 2022-09-28 NOTE — Progress Notes (Unsigned)
09/28/2022: stable 07/06/2022: 100% improvement in migraine frequency and severity not one migriane since last botox 04/13/2022: Stable. Tried amy lomax and would not like to come back, 12 weeks with dr Lucia Gaskins.   01/19/2022: stable 10/19/2021 ALL: Candece returns for Botox. She continues Amovig, verapamil and rizatriptan. She is doing well. She continues to work toward weight loss goal. She is about 11 pounds from goal of 140. She may have 1-2 migraines per month. Easily aborted with Tylenol or rizatriptan.   07/21/2021 ALL: She returns for Botox. She continues Amovig and verapamil. Rizatriptan continues to help with abortive therapy. No headaches since last visit. She is having trouble with severe dizziness. She reports BP was > 200/100 with PT during Epley maneuver. She has seen PCP. CT was unremarkable. 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 Meclizine not helping. She was referred to vestibular therapy and is planning to schedule soon. BP has been well managed.   04/21/2021 ALL: She returns for Botox. She continues Amovig and verapamil. Rizatriptan helps with abortive therapy. She reports that she was not able to pick up Amovig rx from pharmacist this month. She is not sure what has changed but has been able to get Amovig using copay card since 07/2020. She was told she could use card through 07/2021. Insurance prefers [facility] but will not approve Botox and CGRP. I have provided 2 sample pens, today. She was encouraged to contact [employer] to see if she can renew copay card and to speak with [facility] to make sure card was entered correctly. She will call me to update asap.   01/14/2021: She continues Amovig and verapamil. Botox is very helpful. Rizatriptan continues to be effective for abortive therapy. She has had a few more headaches with the weather changes but feels they are mild. She has lost 30 pounds on Optavia. Masseter injections are beneficial to reduce clenching.   10/16/2020 ALL: She is  doing well, today. She was able to get Amovig with copay card. Botox continues to be helpful. She also continues verapamil.  She has had occasional headaches but only 1 severe migraine over the past 3 months. Rizatriptan works well for abortive therapy. She takes it 2-3 times per month, on average. She requests to continue masseter injections.   07/09/2020 ALL: She continues to do well. She did have more headaches with recent diagnosis of Covid and strep throat. Botox does help. Amovig and verapamil also helps. Insurance will not cover both. She was getting medication using copay card. Rizatriptan helps with abortive therapy. She has started PT for acute torticollis. She does feel masseter injections help with clenching.  *Amovig sample (4) given with copay card  03/17/2020 ALL: She continues Amovig every 30 days, verapamil (rx by PCP) and rizatriptan for abortive therapy. She has had worsening headaches since last being seen. She has had some difficulty getting Botox delivered. She has had to use rizatriptan more frequently, recently. She was treated for sinus infection last week with abx and cough syrup. She is feeling a little better now. She has noted an unusual sensation of the left base of neck and head over the past month of being off Botox.     Consent Form Botulism Toxin Injection For Chronic Migraine    Reviewed orally with patient, additionally signature is on file:  Botulism toxin has been approved by the Federal drug administration for treatment of chronic migraine. Botulism toxin does not cure chronic migraine and it may not be effective  in some patients.  The administration of botulism toxin is accomplished by injecting a small amount of toxin into the muscles of the neck and head. Dosage must be titrated for each individual. Any benefits resulting from botulism toxin tend to wear off after 3 months with a repeat injection required if benefit is to be maintained. Injections are usually  done every 3-4 months with maximum effect peak achieved by about 2 or 3 weeks. Botulism toxin is expensive and you should be sure of what costs you will incur resulting from the injection.  The side effects of botulism toxin use for chronic migraine may include:   -Transient, and usually mild, facial weakness with facial injections  -Transient, and usually mild, head or neck weakness with head/neck injections  -Reduction or loss of forehead facial animation due to forehead muscle weakness  -Eyelid drooping  -Dry eye  -Pain at the site of injection or bruising at the site of injection  -Double vision  -Potential unknown long term risks   Contraindications: You should not have Botox if you are pregnant, nursing, allergic to albumin, have an infection, skin condition, or muscle weakness at the site of the injection, or have myasthenia gravis, Lambert-Eaton syndrome, or ALS.  It is also possible that as with any injection, there may be an allergic reaction or no effect from the medication. Reduced effectiveness after repeated injections is sometimes seen and rarely infection at the injection site may occur. All care will be taken to prevent these side effects. If therapy is given over a long time, atrophy and wasting in the muscle injected may occur. Occasionally the patient's become refractory to treatment because they develop antibodies to the toxin. In this event, therapy needs to be modified.  I have read the above information and consent to the administration of botulism toxin.    BOTOX PROCEDURE NOTE FOR MIGRAINE HEADACHE  Contraindications and precautions discussed with patient(above). Aseptic procedure was observed and patient tolerated procedure. Procedure performed by Shawnie Dapper, FNP-C.   The condition has existed for more than 6 months, and pt does not have a diagnosis of ALS, Myasthenia Gravis or Lambert-Eaton Syndrome.  Risks and benefits of injections discussed and pt agrees to  proceed with the procedure.  Written consent obtained  These injections are medically necessary. Pt  receives good benefits from these injections. These injections do not cause sedations or hallucinations which the oral therapies may cause.   Description of procedure:  The patient was placed in a sitting position. The standard protocol was used for Botox as follows, with 5 units of Botox injected at each site:  -Procerus muscle, midline injection  -Corrugator muscle, bilateral injection  -Frontalis muscle, bilateral injection, with 2 sites each side, medial injection was performed in the upper one third of the frontalis muscle, in the region vertical from the medial inferior edge of the superior orbital rim. The lateral injection was again in the upper one third of the forehead vertically above the lateral limbus of the cornea, 1.5 cm lateral to the medial injection site.  -Temporalis muscle injection, 4 sites, bilaterally. The first injection was 3 cm above the tragus of the ear, second injection site was 1.5 cm to 3 cm up from the first injection site in line with the tragus of the ear. The third injection site was 1.5-3 cm forward between the first 2 injection sites. The fourth injection site was 1.5 cm posterior to the second injection site. 5th site laterally in the temporalis  muscleat the level of the outer canthus.  -Occipitalis muscle injection, 3 sites, bilaterally. The first injection was done one half way between the occipital protuberance and the tip of the mastoid process behind the ear. The second injection site was done lateral and superior to the first, 1 fingerbreadth from the first injection. The third injection site was 1 fingerbreadth superiorly and medially from the first injection site.  -Cervical paraspinal muscle injection, 2 sites, bilaterally. The first injection site was 1 cm from the midline of the cervical spine, 3 cm inferior to the lower border of the occipital  protuberance. The second injection site was 1.5 cm superiorly and laterally to the first injection site.  -Trapezius muscle injection was performed at 3 sites, bilaterally. The first injection site was in the upper trapezius muscle halfway between the inflection point of the neck, and the acromion. The second injection site was one half way between the acromion and the first injection site. The third injection was done between the first injection site and the inflection point of the neck.   Will return for repeat injection in 3 months.   A total of 200 units of Botox was prepared, 155 units of Botox was injected as documented above, 45 units of Botox was wasted. The patient tolerated the procedure well, there were no complications of the above procedure.

## 2022-09-28 NOTE — Progress Notes (Unsigned)
Botox- 100 units x 2 vials Lot: [id number] Expiration: 02/2024 NDC: 1610-9604-54  Bacteriostatic 0.9% Sodium Chloride- 4 mL  Lot: [id number] Expiration: 08-16-2023 NDC: 1478-2956-21  Dx: G43.711  B/B Witnessed by Alveria Apley. CMA

## 2022-12-21 ENCOUNTER — Encounter: Payer: Self-pay | Admitting: Neurology

## 2022-12-28 ENCOUNTER — Ambulatory Visit: Payer: Medicare PPO | Admitting: Neurology

## 2023-01-05 ENCOUNTER — Ambulatory Visit: Payer: Medicare PPO | Admitting: Neurology

## 2023-01-05 DIAGNOSIS — G43711 Chronic migraine without aura, intractable, with status migrainosus: Secondary | ICD-10-CM

## 2023-01-05 MED ORDER — ONABOTULINUMTOXINA 200 UNITS IJ SOLR
155.0000 [IU] | Freq: Once | INTRAMUSCULAR | Status: AC
Start: 2023-01-05 — End: 2023-01-05
  Administered 2023-01-05: 155 [IU] via INTRAMUSCULAR

## 2023-01-05 NOTE — Progress Notes (Signed)
01/05/2023: doing great. She is on aimovig pays 100 for 3 months. +a. She has really hypertrophied right traps, right, left, right sided head tilt abd chin to the left. She hass lost over 100 pounds. Will get 100u next time samples for her trap and see if it helps she doesn't pay for botox.  09/28/2022: stable 07/06/2022: 100% improvement in migraine frequency and severity not one migriane since last botox 04/13/2022: Stable. Tried amy lomax and would not like to come back, 12 weeks with dr Lucia Gaskins.   01/19/2022: stable 10/19/2021 ALL: Karicia returns for Botox. She continues Amovig, verapamil and rizatriptan. She is doing well. She continues to work toward weight loss goal. She is about 11 pounds from goal of 140. She may have 1-2 migraines per month. Easily aborted with Tylenol or rizatriptan.   07/21/2021 ALL: She returns for Botox. She continues Amovig and verapamil. Rizatriptan continues to help with abortive therapy. No headaches since last visit. She is having trouble with severe dizziness. She reports BP was > 200/100 with PT during Epley maneuver. She has seen PCP. CT was unremarkable. 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 Meclizine not helping. She was referred to vestibular therapy and is planning to schedule soon. BP has been well managed.   04/21/2021 ALL: She returns for Botox. She continues Amovig and verapamil. Rizatriptan helps with abortive therapy. She reports that she was not able to pick up Amovig rx from pharmacist this month. She is not sure what has changed but has been able to get Amovig using copay card since 07/2020. She was told she could use card through 07/2021. Insurance prefers [facility] but will not approve Botox and CGRP. I have provided 2 sample pens, today. She was encouraged to contact [employer] to see if she can renew copay card and to speak with [facility] to make sure card was entered correctly. She will call me to update asap.   01/14/2021: She continues Amovig and  verapamil. Botox is very helpful. Rizatriptan continues to be effective for abortive therapy. She has had a few more headaches with the weather changes but feels they are mild. She has lost 30 pounds on Optavia. Masseter injections are beneficial to reduce clenching.   10/16/2020 ALL: She is doing well, today. She was able to get Amovig with copay card. Botox continues to be helpful. She also continues verapamil.  She has had occasional headaches but only 1 severe migraine over the past 3 months. Rizatriptan works well for abortive therapy. She takes it 2-3 times per month, on average. She requests to continue masseter injections.   07/09/2020 ALL: She continues to do well. She did have more headaches with recent diagnosis of Covid and strep throat. Botox does help. Amovig and verapamil also helps. Insurance will not cover both. She was getting medication using copay card. Rizatriptan helps with abortive therapy. She has started PT for acute torticollis. She does feel masseter injections help with clenching.  *Amovig sample (4) given with copay card  03/17/2020 ALL: She continues Amovig every 30 days, verapamil (rx by PCP) and rizatriptan for abortive therapy. She has had worsening headaches since last being seen. She has had some difficulty getting Botox delivered. She has had to use rizatriptan more frequently, recently. She was treated for sinus infection last week with abx and cough syrup. She is feeling a little better now. She has noted an unusual sensation of the left base of neck and head over the past month  of being off Botox.     Consent Form Botulism Toxin Injection For Chronic Migraine    Reviewed orally with patient, additionally signature is on file:  Botulism toxin has been approved by the Federal drug administration for treatment of chronic migraine. Botulism toxin does not cure chronic migraine and it may not be effective in some patients.  The administration of botulism toxin is  accomplished by injecting a small amount of toxin into the muscles of the neck and head. Dosage must be titrated for each individual. Any benefits resulting from botulism toxin tend to wear off after 3 months with a repeat injection required if benefit is to be maintained. Injections are usually done every 3-4 months with maximum effect peak achieved by about 2 or 3 weeks. Botulism toxin is expensive and you should be sure of what costs you will incur resulting from the injection.  The side effects of botulism toxin use for chronic migraine may include:   -Transient, and usually mild, facial weakness with facial injections  -Transient, and usually mild, head or neck weakness with head/neck injections  -Reduction or loss of forehead facial animation due to forehead muscle weakness  -Eyelid drooping  -Dry eye  -Pain at the site of injection or bruising at the site of injection  -Double vision  -Potential unknown long term risks   Contraindications: You should not have Botox if you are pregnant, nursing, allergic to albumin, have an infection, skin condition, or muscle weakness at the site of the injection, or have myasthenia gravis, Lambert-Eaton syndrome, or ALS.  It is also possible that as with any injection, there may be an allergic reaction or no effect from the medication. Reduced effectiveness after repeated injections is sometimes seen and rarely infection at the injection site may occur. All care will be taken to prevent these side effects. If therapy is given over a long time, atrophy and wasting in the muscle injected may occur. Occasionally the patient's become refractory to treatment because they develop antibodies to the toxin. In this event, therapy needs to be modified.  I have read the above information and consent to the administration of botulism toxin.    BOTOX PROCEDURE NOTE FOR MIGRAINE HEADACHE  Contraindications and precautions discussed with patient(above). Aseptic  procedure was observed and patient tolerated procedure. Procedure performed by Shawnie Dapper, FNP-C.   The condition has existed for more than 6 months, and pt does not have a diagnosis of ALS, Myasthenia Gravis or Lambert-Eaton Syndrome.  Risks and benefits of injections discussed and pt agrees to proceed with the procedure.  Written consent obtained  These injections are medically necessary. Pt  receives good benefits from these injections. These injections do not cause sedations or hallucinations which the oral therapies may cause.   Description of procedure:  The patient was placed in a sitting position. The standard protocol was used for Botox as follows, with 5 units of Botox injected at each site:  -Procerus muscle, midline injection  -Corrugator muscle, bilateral injection  -Frontalis muscle, bilateral injection, with 2 sites each side, medial injection was performed in the upper one third of the frontalis muscle, in the region vertical from the medial inferior edge of the superior orbital rim. The lateral injection was again in the upper one third of the forehead vertically above the lateral limbus of the cornea, 1.5 cm lateral to the medial injection site.  -Temporalis muscle injection, 4 sites, bilaterally. The first injection was 3 cm above the tragus of the  ear, second injection site was 1.5 cm to 3 cm up from the first injection site in line with the tragus of the ear. The third injection site was 1.5-3 cm forward between the first 2 injection sites. The fourth injection site was 1.5 cm posterior to the second injection site. 5th site laterally in the temporalis  muscleat the level of the outer canthus.  -Occipitalis muscle injection, 3 sites, bilaterally. The first injection was done one half way between the occipital protuberance and the tip of the mastoid process behind the ear. The second injection site was done lateral and superior to the first, 1 fingerbreadth from the first  injection. The third injection site was 1 fingerbreadth superiorly and medially from the first injection site.  -Cervical paraspinal muscle injection, 2 sites, bilaterally. The first injection site was 1 cm from the midline of the cervical spine, 3 cm inferior to the lower border of the occipital protuberance. The second injection site was 1.5 cm superiorly and laterally to the first injection site.  -Trapezius muscle injection was performed at 3 sites, bilaterally. The first injection site was in the upper trapezius muscle halfway between the inflection point of the neck, and the acromion. The second injection site was one half way between the acromion and the first injection site. The third injection was done between the first injection site and the inflection point of the neck.   Will return for repeat injection in 3 months.   A total of 200 units of Botox was prepared, 155 units of Botox was injected as documented above, 45 units of Botox was wasted. The patient tolerated the procedure well, there were no complications of the above procedure.

## 2023-01-05 NOTE — Progress Notes (Signed)
Botox- 200 units x 1 vial Lot: [id number] Expiration: 04/2025 NDC: 1610-9604-54  Bacteriostatic 0.9% Sodium Chloride- 4 mL  Lot: [id number] Expiration: 08/16/2023 NDC: 1478-2956-21  Dx: H08.657  B/B Witnessed by: Elana Alm

## 2023-03-27 ENCOUNTER — Encounter: Payer: Self-pay | Admitting: Neurology

## 2023-04-06 ENCOUNTER — Ambulatory Visit: Payer: Medicare PPO | Admitting: Neurology

## 2023-04-12 ENCOUNTER — Ambulatory Visit: Payer: Medicare PPO | Admitting: Family Medicine

## 2023-04-17 ENCOUNTER — Other Ambulatory Visit: Payer: Self-pay | Admitting: Neurology

## 2023-04-18 NOTE — Telephone Encounter (Signed)
Rx refilled per last office visit note.

## 2023-04-19 ENCOUNTER — Ambulatory Visit: Payer: Medicare PPO | Admitting: Family Medicine

## 2023-04-19 NOTE — Progress Notes (Unsigned)
04/20/2023 ALL: Jenna Chang returns for Botox. She has been seeing Dr Lucia Gaskins for Botox procedures. She continues Amovig, verapamil and rizatriptan. She reports migraines are well managed. She does not increased frequency about 2 weeks prior to Botox.   10/19/2021 ALL: Jenna Chang returns for Botox. She continues Amovig, verapamil and rizatriptan. She is doing well. She continues to work toward weight loss goal. She is about 11 pounds from goal of 140. She may have 1-2 migraines per month. Easily aborted with Tylenol or rizatriptan.   07/21/2021 ALL: She returns for Botox. She continues Amovig and verapamil. Rizatriptan continues to help with abortive therapy. No headaches since last visit. She is having trouble with severe dizziness. She reports BP was > 200/100 with PT during Epley maneuver. She has seen PCP. CT was unremarkable. 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 Meclizine not helping. She was referred to vestibular therapy and is planning to schedule soon. BP has been well managed.   04/21/2021 ALL: She returns for Botox. She continues Amovig and verapamil. Rizatriptan helps with abortive therapy. She reports that she was not able to pick up Amovig rx from pharmacist this month. She is not sure what has changed but has been able to get Amovig using copay card since 07/2020. She was told she could use card through 07/2021. Insurance prefers [facility] but will not approve Botox and CGRP. I have provided 2 sample pens, today. She was encouraged to contact [employer] to see if she can renew copay card and to speak with [facility] pharmacy to make sure card was entered correctly. She will call me to update asap.   01/14/2021: She continues Amovig and verapamil. Botox is very helpful. Rizatriptan continues to be effective for abortive therapy. She has had a few more headaches with the weather changes but feels they are mild. She has lost 30 pounds on Optavia. Masseter injections are beneficial to reduce clenching.    10/16/2020 ALL: She is doing well, today. She was able to get Amovig with copay card. Botox continues to be helpful. She also continues verapamil.  She has had occasional headaches but only 1 severe migraine over the past 3 months. Rizatriptan works well for abortive therapy. She takes it 2-3 times per month, on average. She requests to continue masseter injections.   07/09/2020 ALL: She continues to do well. She did have more headaches with recent diagnosis of Covid and strep throat. Botox does help. Amovig and verapamil also helps. Insurance will not cover both. She was getting medication using copay card. Rizatriptan helps with abortive therapy. She has started PT for acute torticollis. She does feel masseter injections help with clenching.  *Amovig sample (4) given with copay card  03/17/2020 ALL: She continues Amovig every 30 days, verapamil (rx by PCP) and rizatriptan for abortive therapy. She has had worsening headaches since last being seen. She has had some difficulty getting Botox delivered. She has had to use rizatriptan more frequently, recently. She was treated for sinus infection last week with abx and cough syrup. She is feeling a little better now. She has noted an unusual sensation of the left base of neck and head over the past month of being off Botox.     Consent Form Botulism Toxin Injection For Chronic Migraine    Reviewed orally with patient, additionally signature is on file:  Botulism toxin has been approved by the Federal drug administration for treatment of chronic migraine. Botulism toxin does not cure chronic migraine and it  may not be effective in some patients.  The administration of botulism toxin is accomplished by injecting a small amount of toxin into the muscles of the neck and head. Dosage must be titrated for each individual. Any benefits resulting from botulism toxin tend to wear off after 3 months with a repeat injection required if benefit is to be  maintained. Injections are usually done every 3-4 months with maximum effect peak achieved by about 2 or 3 weeks. Botulism toxin is expensive and you should be sure of what costs you will incur resulting from the injection.  The side effects of botulism toxin use for chronic migraine may include:   -Transient, and usually mild, facial weakness with facial injections  -Transient, and usually mild, head or neck weakness with head/neck injections  -Reduction or loss of forehead facial animation due to forehead muscle weakness  -Eyelid drooping  -Dry eye  -Pain at the site of injection or bruising at the site of injection  -Double vision  -Potential unknown long term risks   Contraindications: You should not have Botox if you are pregnant, nursing, allergic to albumin, have an infection, skin condition, or muscle weakness at the site of the injection, or have myasthenia gravis, Lambert-Eaton syndrome, or ALS.  It is also possible that as with any injection, there may be an allergic reaction or no effect from the medication. Reduced effectiveness after repeated injections is sometimes seen and rarely infection at the injection site may occur. All care will be taken to prevent these side effects. If therapy is given over a long time, atrophy and wasting in the muscle injected may occur. Occasionally the patient's become refractory to treatment because they develop antibodies to the toxin. In this event, therapy needs to be modified.  I have read the above information and consent to the administration of botulism toxin.    BOTOX PROCEDURE NOTE FOR MIGRAINE HEADACHE  Contraindications and precautions discussed with patient(above). Aseptic procedure was observed and patient tolerated procedure. Procedure performed by Shawnie Dapper, FNP-C.   The condition has existed for more than 6 months, and pt does not have a diagnosis of ALS, Myasthenia Gravis or Lambert-Eaton Syndrome.  Risks and benefits of  injections discussed and pt agrees to proceed with the procedure.  Written consent obtained  These injections are medically necessary. Pt  receives good benefits from these injections. These injections do not cause sedations or hallucinations which the oral therapies may cause.   Description of procedure:  The patient was placed in a sitting position. The standard protocol was used for Botox as follows, with 5 units of Botox injected at each site:  -Procerus muscle, midline injection  -Corrugator muscle, bilateral injection  -Frontalis muscle, bilateral injection, with 2 sites each side, medial injection was performed in the upper one third of the frontalis muscle, in the region vertical from the medial inferior edge of the superior orbital rim. The lateral injection was again in the upper one third of the forehead vertically above the lateral limbus of the cornea, 1.5 cm lateral to the medial injection site.  -Temporalis muscle injection, 4 sites, bilaterally. The first injection was 3 cm above the tragus of the ear, second injection site was 1.5 cm to 3 cm up from the first injection site in line with the tragus of the ear. The third injection site was 1.5-3 cm forward between the first 2 injection sites. The fourth injection site was 1.5 cm posterior to the second injection site. 5th site  laterally in the temporalis  muscleat the level of the outer canthus.  -Occipitalis muscle injection, 3 sites, bilaterally. The first injection was done one half way between the occipital protuberance and the tip of the mastoid process behind the ear. The second injection site was done lateral and superior to the first, 1 fingerbreadth from the first injection. The third injection site was 1 fingerbreadth superiorly and medially from the first injection site.  -Cervical paraspinal muscle injection, 2 sites, bilaterally. The first injection site was 1 cm from the midline of the cervical spine, 3 cm inferior to  the lower border of the occipital protuberance. The second injection site was 1.5 cm superiorly and laterally to the first injection site.  -Trapezius muscle injection was performed at 3 sites, bilaterally. The first injection site was in the upper trapezius muscle halfway between the inflection point of the neck, and the acromion. The second injection site was one half way between the acromion and the first injection site. The third injection was done between the first injection site and the inflection point of the neck.  -Masseter 5 units injected bilaterally    Will return for repeat injection in 3 months.   A total of 200 units of Botox was prepared, 165 units of Botox was injected as documented above, 45 units of Botox was wasted. The patient tolerated the procedure well, there were no complications of the above procedure.

## 2023-04-20 ENCOUNTER — Ambulatory Visit: Payer: Medicare PPO | Admitting: Family Medicine

## 2023-04-20 DIAGNOSIS — G43711 Chronic migraine without aura, intractable, with status migrainosus: Secondary | ICD-10-CM

## 2023-04-20 MED ORDER — ONABOTULINUMTOXINA 200 UNITS IJ SOLR
155.0000 [IU] | Freq: Once | INTRAMUSCULAR | Status: AC
  Administered 2023-04-20: 165 [IU] via INTRAMUSCULAR

## 2023-04-20 NOTE — Progress Notes (Signed)
Botox- 200 units x 1 vial Lot: [id number] Expiration:01/2025 NDC: 0023-3921-02  Bacteriostatic 0.9% Sodium Chloride- * mL  Lot: [id number] Expiration: 08/16/2023 BMW:4132-4401-02  Dx: G43.711 B/B Witnessed by; Leandra Kern, CMA

## 2023-06-20 ENCOUNTER — Telehealth: Payer: Self-pay | Admitting: Family Medicine

## 2023-06-20 NOTE — Telephone Encounter (Signed)
Submitted auth to [facility] via CMM, status is pending. Key: Jenna Chang

## 2023-06-20 NOTE — Telephone Encounter (Signed)
Auth#: [id number] (08/13/21-05/16/24)

## 2023-07-13 ENCOUNTER — Ambulatory Visit: Payer: Medicare PPO | Admitting: Family Medicine

## 2023-07-14 ENCOUNTER — Ambulatory Visit: Payer: Medicare PPO | Admitting: Neurology

## 2023-07-14 DIAGNOSIS — G43711 Chronic migraine without aura, intractable, with status migrainosus: Secondary | ICD-10-CM

## 2023-07-14 MED ORDER — ONABOTULINUMTOXINA 100 UNITS IJ SOLR
155.0000 [IU] | Freq: Once | INTRAMUSCULAR | Status: AC
  Administered 2023-07-14: 155 [IU] via INTRAMUSCULAR

## 2023-07-14 NOTE — Progress Notes (Unsigned)
   If she wants a follow up with me she can come back to me or have a routine visit.

## 2023-07-14 NOTE — Progress Notes (Unsigned)
 Botox- 100 units x 2 vials Lot: [id number] Expiration: 09/2025 NDC: 2706-2376-28  Bacteriostatic 0.9% Sodium Chloride- 4 mL  Lot: [id number] Expiration: 03/17/2024 NDC: 1607-3710-62  Dx: I94.854 B/B Witnessed by Truitt Leep RN

## 2023-08-30 ENCOUNTER — Telehealth: Payer: Self-pay

## 2023-08-30 ENCOUNTER — Other Ambulatory Visit (HOSPITAL_COMMUNITY): Payer: Self-pay

## 2023-08-30 NOTE — Telephone Encounter (Signed)
 Pharmacy Patient Advocate Encounter   Received notification from Fax that prior authorization for Aimovig 140MG /ML auto-injectors is required/requested.   Insurance verification completed.   The patient is insured through [employer] .   Per test claim: PA required; PA submitted to above mentioned insurance via CoverMyMeds Key/confirmation #/EOC [id number] Status is pending

## 2023-08-31 NOTE — Telephone Encounter (Signed)
 Pharmacy Patient Advocate Encounter  Received notification from [employer] that Prior Authorization for Aimovig 140MG /ML auto-injectors has been DENIED.  Full denial letter will be uploaded to the media tab. See denial reason below.   PA #/Case ID/Reference #: PA Case ID #: [id number]

## 2023-09-14 ENCOUNTER — Encounter: Payer: Self-pay | Admitting: Family Medicine

## 2023-09-14 ENCOUNTER — Telehealth (INDEPENDENT_AMBULATORY_CARE_PROVIDER_SITE_OTHER): Admitting: Family Medicine

## 2023-09-14 DIAGNOSIS — G43711 Chronic migraine without aura, intractable, with status migrainosus: Secondary | ICD-10-CM | POA: Diagnosis not present

## 2023-09-14 MED ORDER — AJOVY 225 MG/1.5ML ~~LOC~~ SOAJ: 225.0000 mg | SUBCUTANEOUS | 3 refills | Status: DC

## 2023-09-14 NOTE — Progress Notes (Signed)
   PATIENT: Jenna Chang DOB: [birth year]  REASON FOR VISIT: follow up HISTORY FROM: patient  Virtual Visit via MyChart video  I connected with Jenna Chang on 09/14/23 at  9:00 AM EDT via MyChart video and verified that I am speaking with the correct person using two identifiers.   I discussed the limitations, risks, security and privacy concerns of performing an evaluation and management service by Mychart video and the availability of in person appointments. I also discussed with the patient that there may be a patient responsible charge related to this service. The patient expressed understanding and agreed to proceed.   History of Present Illness:  09/14/23 ALL: Jenna Chang is a 68 y.o. female here today for follow up for worsening migraines. She was last seen by Dr Tresia Fruit for Botox  07/14/2023 and reported nearly complete resolution of migraines on Amovig and Botox . Amovig was denied 08/2023. Emgality or Ajovy preferred. Since, headaches have returned. She reports having 14-15 migraine days over the past month. Rizatriptan  usually helps but she may have to repeat dose.    Observations/Objective:  Generalized: Well developed, in no acute distress  Mentation: Alert oriented to time, place, history taking. Follows all commands speech and language fluent   Assessment and Plan:  68 y.o. year old female  has a past medical history of Acid reflux, Anemia, Anxiety, Back pain, Bilateral swelling of feet, Bursitis, Cancer (HCC), Cataract, Chest pain, Constipation, Cough, DDD (degenerative disc disease), cervical, Depression, Ear pain, Gallbladder disease, GERD (gastroesophageal reflux disease), Hiatal hernia, HTN (hypertension), IBS (irritable bowel syndrome), IC (interstitial cystitis), Low sodium levels, Multinodular goiter, OA (osteoarthritis), Osteopenia, Scoliosis, Sleep apnea, Spinal stenosis, and Vitamin D  deficiency. here with    ICD-10-CM   1. Chronic migraine without aura, with  intractable migraine, so stated, with status migrainosus  G43.711 Fremanezumab-vfrm (AJOVY) 225 MG/1.5ML SOAJ     Jenna Chang reports worsening migraines since not receiving last dose of Amovig. Insurance prefers Ajovy. We will switch her to Ajovy every 30 days. Continue Botox  every 12 weeks and rizatriptan  as needed. Complementary therapies and supplements advised for intractable migraines as directed in AVS. She will follow up in 6-12 months.   No orders of the defined types were placed in this encounter.   Meds ordered this encounter  Medications   Fremanezumab-vfrm (AJOVY) 225 MG/1.5ML SOAJ    Sig: Inject 225 mg into the skin every 30 (thirty) days.    Dispense:  4.5 mL    Refill:  3    Supervising Provider:   Glory Larsen [[id number]]     Follow Up Instructions:  I discussed the assessment and treatment plan with the patient. The patient was provided an opportunity to ask questions and all were answered. The patient agreed with the plan and demonstrated an understanding of the instructions.   The patient was advised to call back or seek an in-person evaluation if the symptoms worsen or if the condition fails to improve as anticipated.  I provided 15 minutes of face-to-face and non face-to-face time during this MyChart video encounter. Patient located at their place of residence. Provider is in the office.    Jenna Dedmon, NP

## 2023-09-14 NOTE — Patient Instructions (Signed)
 Below is our plan:  We will switch Amovig to Ajovy per insurance. Continue Botox  every 12 weeks and rizatriptan  as needed. Please take 1 tablet at onset of headache. May take 1 additional tablet in 2 hours if needed. Do not take more than 2 tablets in 24 hours or more than 10 in a month.   You can add supplements below to help with worsening/intractable migraines. You can also consider taking Tylenol 500mg  with ibuprofen 400mg  and Benadryl 25mg  for intractable migraines not responding to rizatriptan .   Please make sure you are staying well hydrated. I recommend 50-60 ounces daily. Well balanced diet and regular exercise encouraged. Consistent sleep schedule with 6-8 hours recommended.   Please continue follow up with care team as directed.   Follow up with me in 6-12 months.   You may receive a survey regarding today's visit. I encourage you to leave honest feed back as I do use this information to improve patient care. Thank you for seeing me today!   GENERAL HEADACHE INFORMATION:   Natural supplements: Magnesium Oxide or Magnesium Glycinate 500 mg at bed (up to 800 mg daily) Coenzyme Q10 300 mg in AM Vitamin B2- 200 mg twice a day   Add 1 supplement at a time since even natural supplements can have undesirable side effects. You can sometimes buy supplements cheaper (especially Coenzyme Q10) at www.WebmailGuide.co.za or at [facility].  Migraine with aura: There is increased risk for stroke in women with migraine with aura and a contraindication for the combined contraceptive pill for use by women who have migraine with aura. The risk for women with migraine without aura is lower. However other risk factors like smoking are far more likely to increase stroke risk than migraine. There is a recommendation for no smoking and for the use of OCPs without estrogen such as progestogen only pills particularly for women with migraine with aura.Aaron Aas People who have migraine headaches with auras may be 3 times more  likely to have a stroke caused by a blood clot, compared to migraine patients who don't see auras. Women who take hormone-replacement therapy may be 30 percent more likely to suffer a clot-based stroke than women not taking medication containing estrogen. Other risk factors like smoking and high blood pressure may be  much more important.    Vitamins and herbs that show potential:   Magnesium: Magnesium (250 mg twice a day or 500 mg at bed) has a relaxant effect on smooth muscles such as blood vessels. Individuals suffering from frequent or daily headache usually have low magnesium levels which can be increase with daily supplementation of 400-750 mg. Three trials found 40-90% average headache reduction  when used as a preventative. Magnesium may help with headaches are aura, the best evidence for magnesium is for migraine with aura is its thought to stop the cortical spreading depression we believe is the pathophysiology of migraine aura.Magnesium also demonstrated the benefit in menstrually related migraine.  Magnesium is part of the messenger system in the serotonin cascade and it is a good muscle relaxant.  It is also useful for constipation which can be a side effect of other medications used to treat migraine. Good sources include nuts, whole grains, and tomatoes. Side Effects: loose stool/diarrhea  Riboflavin (vitamin B 2) 200 mg twice a day. This vitamin assists nerve cells in the production of ATP a principal energy storing molecule.  It is necessary for many chemical reactions in the body.  There have been at least 3 clinical  trials of riboflavin using 400 mg per day all of which suggested that migraine frequency can be decreased.  All 3 trials showed significant improvement in over half of migraine sufferers.  The supplement is found in bread, cereal, milk, meat, and poultry.  Most Americans get more riboflavin than the recommended daily allowance, however riboflavin deficiency is not necessary for  the supplements to help prevent headache. Side effects: energizing, green urine   Coenzyme Q10: This is present in almost all cells in the body and is critical component for the conversion of energy.  Recent studies have shown that a nutritional supplement of CoQ10 can reduce the frequency of migraine attacks by improving the energy production of cells as with riboflavin.  Doses of 150 mg twice a day have been shown to be effective.   Melatonin: Increasing evidence shows correlation between melatonin secretion and headache conditions.  Melatonin supplementation has decreased headache intensity and duration.  It is widely used as a sleep aid.  Sleep is natures way of dealing with migraine.  A dose of 3 mg is recommended to start for headaches including cluster headache. Higher doses up to 15 mg has been reviewed for use in Cluster headache and have been used. The rationale behind using melatonin for cluster is that many theories regarding the cause of Cluster headache center around the disruption of the normal circadian rhythm in the brain.  This helps restore the normal circadian rhythm.   HEADACHE DIET: Foods and beverages which may trigger migraine Note that only 20% of headache patients are food sensitive. You will know if you are food sensitive if you get a headache consistently 20 minutes to 2 hours after eating a certain food. Only cut out a food if it causes headaches, otherwise you might remove foods you enjoy! What matters most for diet is to eat a well balanced healthy diet full of vegetables and low fat protein, and to not miss meals.   Chocolate, other sweets ALL cheeses except cottage and cream cheese Dairy products, yogurt, sour cream, ice cream Liver Meat extracts (Bovril, Marmite, meat tenderizers) Meats or fish which have undergone aging, fermenting, pickling or smoking. These include: Hotdogs,salami,Lox,sausage, mortadellas,smoked salmon, pepperoni, Pickled herring Pods of broad  bean (English beans, Chinese pea pods, [place] (fava) beans, lima and navy beans Ripe avocado, ripe banana Yeast extracts or active yeast preparations such as Brewer's or Fleishman's (commercial bakes goods are permitted) Tomato based foods, pizza (lasagna, etc.)   MSG (monosodium glutamate) is disguised as many things; look for these common aliases: Monopotassium glutamate Autolysed yeast Hydrolysed protein Sodium caseinate "flavorings" "all natural preservatives" Nutrasweet   Avoid all other foods that convincingly provoke headaches.   Resources: The Dizzy Althia Jetty Your Headache Diet, migrainestrong.com  https://zamora-andrews.com/   Caffeine and Migraine For patients that have migraine, caffeine intake more than 3 days per week can lead to dependency and increased migraine frequency. I would recommend cutting back on your caffeine intake as best you can. The recommended amount of caffeine is 200-300 mg daily, although migraine patients may experience dependency at even lower doses. While you may notice an increase in headache temporarily, cutting back will be helpful for headaches in the long run. For more information on caffeine and migraine, visit: https://americanmigrainefoundation.org/resource-library/caffeine-and-migraine/   Headache Prevention Strategies:   1. Maintain a headache diary; learn to identify and avoid triggers.  - This can be a simple note where you log when you had a headache, associated symptoms, and medications used -  There are several smartphone apps developed to help track migraines: Migraine Buddy, Migraine Monitor, Curelator N1-Headache App   Common triggers include: Emotional triggers: Emotional/Upset family or friends Emotional/Upset occupation Business reversal/success Anticipation anxiety Crisis-serious Post-crisis periodNew job/position   Physical triggers: Vacation Day Weekend Strenuous  Exercise High Altitude Location New Move Menstrual Day Physical Illness Oversleep/Not enough sleep Weather changes Light: Photophobia or light sesnitivity treatment involves a balance between desensitization and reduction in overly strong input. Use dark polarized glasses outside, but not inside. Avoid bright or fluorescent light, but do not dim environment to the point that going into a normally lit room hurts. Consider FL-41 tint lenses, which reduce the most irritating wavelengths without blocking too much light.  These can be obtained at axonoptics.com or theraspecs.com Foods: see list above.   2. Limit use of acute treatments (over-the-counter medications, triptans, etc.) to no more than 2 days per week or 10 days per month to prevent medication overuse headache (rebound headache).     3. Follow a regular schedule (including weekends and holidays): Don't skip meals. Eat a balanced diet. 8 hours of sleep nightly. Minimize stress. Exercise 30 minutes per day. Being overweight is associated with a 5 times increased risk of chronic migraine. Keep well hydrated and drink 6-8 glasses of water per day.   4. Initiate non-pharmacologic measures at the earliest onset of your headache. Rest and quiet environment. Relax and reduce stress. Breathe2Relax is a free app that can instruct you on    some simple relaxtion and breathing techniques. Http://Dawnbuse.com is a    free website that provides teaching videos on relaxation.  Also, there are  many apps that   can be downloaded for "mindful" relaxation.  An app called YOGA NIDRA will help walk you through mindfulness. Another app called Calm can be downloaded to give you a structured mindfulness guide with daily reminders and skill development. Headspace for guided meditation Mindfulness Based Stress Reduction Online Course: www.palousemindfulness.com Cold compresses.   5. Don't wait!! Take the maximum allowable dosage of prescribed medication at  the first sign of migraine.   6. Compliance:  Take prescribed medication regularly as directed and at the first sign of a migraine.   7. Communicate:  Call your physician when problems arise, especially if your headaches change, increase in frequency/severity, or become associated with neurological symptoms (weakness, numbness, slurred speech, etc.). Proceed to emergency room if you experience new or worsening symptoms or symptoms do not resolve, if you have new neurologic symptoms or if headache is severe, or for any concerning symptom.   8. Headache/pain management therapies: Consider various complementary methods, including medication, behavioral therapy, psychological counselling, biofeedback, massage therapy, acupuncture, dry needling, and other modalities.  Such measures may reduce the need for medications. Counseling for pain management, where patients learn to function and ignore/minimize their pain, seems to work very well.   9. Recommend changing family's attention and focus away from patient's headaches. Instead, emphasize daily activities. If first question of day is 'How are your headaches/Do you have a headache today?', then patient will constantly think about headaches, thus making them worse. Goal is to re-direct attention away from headaches, toward daily activities and other distractions.   10. Helpful Websites: www.AmericanHeadacheSociety.org PatentHood.ch www.headaches.org TightMarket.nl www.achenet.org

## 2023-09-20 ENCOUNTER — Other Ambulatory Visit (HOSPITAL_COMMUNITY): Payer: Self-pay

## 2023-09-20 ENCOUNTER — Telehealth: Payer: Self-pay

## 2023-09-20 NOTE — Telephone Encounter (Addendum)
 Patient aware PA Ajovy  sent this am high priority to PA team

## 2023-09-20 NOTE — Telephone Encounter (Signed)
 Done

## 2023-09-20 NOTE — Telephone Encounter (Signed)
 Pharmacy Patient Advocate Encounter   Received notification from Physician's Office that prior authorization for AJOVY  (fremanezumab -vfrm) injection 225MG /1.5ML auto-injectors is required/requested.   Insurance verification completed.   The patient is insured through [place] .   Per test claim: PA required; PA submitted to above mentioned insurance via CoverMyMeds Key/confirmation #/EOC [facility] Status is pending

## 2023-09-21 ENCOUNTER — Other Ambulatory Visit (HOSPITAL_COMMUNITY): Payer: Self-pay

## 2023-09-21 NOTE — Telephone Encounter (Signed)
 Pharmacy Patient Advocate Encounter  Received notification from [employer] that Prior Authorization for AJOVY  (fremanezumab -vfrm) injection 225MG /1.5ML auto-injectors has been DENIED.  Full denial letter will be uploaded to the media tab. See denial reason below.   PA #/Case ID/Reference #: PA Case ID #: [id number]

## 2023-09-22 ENCOUNTER — Encounter: Payer: Self-pay | Admitting: Family Medicine

## 2023-09-22 MED ORDER — EMGALITY 120 MG/ML ~~LOC~~ SOAJ
120.0000 mg | SUBCUTANEOUS | 0 refills | Status: DC
Start: 2023-09-22 — End: 2023-11-28

## 2023-09-22 NOTE — Telephone Encounter (Signed)
 Amy- do you want to switch to either Emgality or Qulipta? Ajovy  denied. Has to try both these options first.

## 2023-09-23 ENCOUNTER — Telehealth: Payer: Self-pay | Admitting: Pharmacist

## 2023-09-23 ENCOUNTER — Other Ambulatory Visit (HOSPITAL_COMMUNITY): Payer: Self-pay

## 2023-09-23 NOTE — Telephone Encounter (Signed)
 Pharmacy Patient Advocate Encounter   Received notification from Patient Pharmacy that prior authorization for Emgality  120MG /ML auto-injectors (migraine) is required/requested.   Insurance verification completed.   The patient is insured through [place] .   Per test claim: PA required; PA submitted to above mentioned insurance via CoverMyMeds Key/confirmation #/EOC [id number] Status is pending

## 2023-09-23 NOTE — Telephone Encounter (Signed)
 Pharmacy Patient Advocate Encounter  Received notification from [employer] that Prior Authorization for Emgality  120MG /ML auto-injectors (migraine) has been APPROVED from 05/18/2023 to 05/16/2024. Ran test claim, Copay is $50. This test claim was processed through [facility]- copay amounts may vary at other pharmacies due to pharmacy/plan contracts, or as the patient moves through the different stages of their insurance plan.   PA #/Case ID/Reference #: [id number]

## 2023-10-06 ENCOUNTER — Ambulatory Visit: Payer: Medicare PPO | Admitting: Adult Health

## 2023-10-06 VITALS — BP 140/84 | HR 68

## 2023-10-06 DIAGNOSIS — G43711 Chronic migraine without aura, intractable, with status migrainosus: Secondary | ICD-10-CM | POA: Diagnosis not present

## 2023-10-06 MED ORDER — ONABOTULINUMTOXINA 200 UNITS IJ SOLR
155.0000 [IU] | Freq: Once | INTRAMUSCULAR | Status: AC
  Administered 2023-10-06: 155 [IU] via INTRAMUSCULAR

## 2023-10-06 NOTE — Progress Notes (Signed)
 Update 10/06/2023 JM: patient returns for repeat botox  injection. Prior injection 07/14/2023 with Dr. Tresia Fruit. Migraines previously very well controlled on Aimovig  and botox  but unfortunately insurance denied Aimovig . She was transitioned to Emgality  (first injection 5/12), has noticed some improvement of migraines already but still occurring. Advised giving Emgality  more time to take effect but if migraines persist, could consider trial of Ajovy  or restart of Aimovig  (based on insurance requirements). Tolerated procedure well today. Return in 3 months for repeat injection.         Consent Form Botulism Toxin Injection For Chronic Migraine    Reviewed orally with patient, additionally signature is on file:  Botulism toxin has been approved by the Federal drug administration for treatment of chronic migraine. Botulism toxin does not cure chronic migraine and it may not be effective in some patients.  The administration of botulism toxin is accomplished by injecting a small amount of toxin into the muscles of the neck and head. Dosage must be titrated for each individual. Any benefits resulting from botulism toxin tend to wear off after 3 months with a repeat injection required if benefit is to be maintained. Injections are usually done every 3-4 months with maximum effect peak achieved by about 2 or 3 weeks. Botulism toxin is expensive and you should be sure of what costs you will incur resulting from the injection.  The side effects of botulism toxin use for chronic migraine may include:   -Transient, and usually mild, facial weakness with facial injections  -Transient, and usually mild, head or neck weakness with head/neck injections  -Reduction or loss of forehead facial animation due to forehead muscle weakness  -Eyelid drooping  -Dry eye  -Pain at the site of injection or bruising at the site of injection  -Double vision  -Potential unknown long term risks   Contraindications:  You should not have Botox  if you are pregnant, nursing, allergic to albumin, have an infection, skin condition, or muscle weakness at the site of the injection, or have myasthenia gravis, Lambert-Eaton syndrome, or ALS.  It is also possible that as with any injection, there may be an allergic reaction or no effect from the medication. Reduced effectiveness after repeated injections is sometimes seen and rarely infection at the injection site may occur. All care will be taken to prevent these side effects. If therapy is given over a long time, atrophy and wasting in the muscle injected may occur. Occasionally the patient's become refractory to treatment because they develop antibodies to the toxin. In this event, therapy needs to be modified.  I have read the above information and consent to the administration of botulism toxin.    BOTOX  PROCEDURE NOTE FOR MIGRAINE HEADACHE  Contraindications and precautions discussed with patient(above). Aseptic procedure was observed and patient tolerated procedure. Procedure performed by Johny Nap, AGNP-BC.   The condition has existed for more than 6 months, and pt does not have a diagnosis of ALS, Myasthenia Gravis or Lambert-Eaton Syndrome.  Risks and benefits of injections discussed and pt agrees to proceed with the procedure.  Written consent obtained  These injections are medically necessary. Pt  receives good benefits from these injections. These injections do not cause sedations or hallucinations which the oral therapies may cause.   Description of procedure:  The patient was placed in a sitting position. The standard protocol was used for Botox  as follows, with 5 units of Botox  injected at each site:  -Procerus muscle, midline injection  -Corrugator muscle, bilateral  injection  -Frontalis muscle, bilateral injection, with 2 sites each side, medial injection was performed in the upper one third of the frontalis muscle, in the region vertical  from the medial inferior edge of the superior orbital rim. The lateral injection was again in the upper one third of the forehead vertically above the lateral limbus of the cornea, 1.5 cm lateral to the medial injection site.  -Temporalis muscle injection, 4 sites, bilaterally. The first injection was 3 cm above the tragus of the ear, second injection site was 1.5 cm to 3 cm up from the first injection site in line with the tragus of the ear. The third injection site was 1.5-3 cm forward between the first 2 injection sites. The fourth injection site was 1.5 cm posterior to the second injection site. 5th site laterally in the temporalis  muscleat the level of the outer canthus.  -Occipitalis muscle injection, 3 sites, bilaterally. The first injection was done one half way between the occipital protuberance and the tip of the mastoid process behind the ear. The second injection site was done lateral and superior to the first, 1 fingerbreadth from the first injection. The third injection site was 1 fingerbreadth superiorly and medially from the first injection site.  -Cervical paraspinal muscle injection, 2 sites, bilaterally. The first injection site was 1 cm from the midline of the cervical spine, 3 cm inferior to the lower border of the occipital protuberance. The second injection site was 1.5 cm superiorly and laterally to the first injection site.  -Trapezius muscle injection was performed at 3 sites, bilaterally. The first injection site was in the upper trapezius muscle halfway between the inflection point of the neck, and the acromion. The second injection site was one half way between the acromion and the first injection site. The third injection was done between the first injection site and the inflection point of the neck.    A total of 200 units of Botox  was prepared, 155 units of Botox  was injected as documented above, any Botox  not injected was wasted. The patient tolerated the procedure well,  there were no complications of the above procedure.   Johny Nap, AGNP-BC  [facility] [address] [place], [place] [address]  Phone [phone] Fax [phone] Note: This document was prepared with digital dictation and possible smart phrase technology. Any transcriptional errors that result from this process are unintentional.

## 2023-10-06 NOTE — Progress Notes (Signed)
 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

## 2023-11-14 ENCOUNTER — Encounter: Payer: Self-pay | Admitting: Family Medicine

## 2023-11-26 ENCOUNTER — Encounter: Payer: Self-pay | Admitting: Neurology

## 2023-11-28 MED ORDER — EMGALITY 120 MG/ML ~~LOC~~ SOAJ: 120.0000 mg | SUBCUTANEOUS | 5 refills | Status: DC

## 2024-01-03 ENCOUNTER — Ambulatory Visit: Admitting: Adult Health

## 2024-01-04 NOTE — Progress Notes (Unsigned)
 01/05/2024 ALL: Jenna Chang returns for Botox . She was last seen by Harlene Bogaert, NP. She had recently started Emgality  and felt it may be helping some. Since, she reports doing fair. She is having regular migraines. Maybe 3 a week or so. Rizatriptan  works well but she usually needs two doses. She is currently being worked up for concerns of multiple myeloma.   10/06/2023 JM: patient returns for repeat botox  injection. Prior injection 07/14/2023 with Dr. Ines. Migraines previously very well controlled on Aimovig  and botox  but unfortunately insurance denied Aimovig . She was transitioned to Emgality  (first injection 5/12), has noticed some improvement of migraines already but still occurring. Advised giving Emgality  more time to take effect but if migraines persist, could consider trial of Ajovy  or restart of Aimovig  (based on insurance requirements). Tolerated procedure well today. Return in 3 months for repeat injection.   07/14/2023 AA: routine botox  protocol. She is doing well, almost complete resolution of migraines.   04/20/2023 ALL: Jenna Chang returns for Botox . She has been seeing Dr Ines for Botox  procedures. She continues Amovig, verapamil and rizatriptan . She reports migraines are well managed. She does not increased frequency about 2 weeks prior to Botox .   10/19/2021 ALL: Jenna Chang returns for Botox . She continues Amovig, verapamil and rizatriptan . She is doing well. She continues to work toward weight loss goal. She is about 11 pounds from goal of 140. She may have 1-2 migraines per month. Easily aborted with Tylenol or rizatriptan .   07/21/2021 ALL: She returns for Botox . She continues Amovig and verapamil. Rizatriptan  continues to help with abortive therapy. No headaches since last visit. She is having trouble with severe dizziness. She reports BP was > 200/100 with PT during Epley maneuver. She has seen PCP. CT was unremarkable. 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 Meclizine not helping. She was  referred to vestibular therapy and is planning to schedule soon. BP has been well managed.   04/21/2021 ALL: She returns for Botox . She continues Amovig and verapamil. Rizatriptan  helps with abortive therapy. She reports that she was not able to pick up Amovig rx from pharmacist this month. She is not sure what has changed but has been able to get Amovig using copay card since 07/2020. She was told she could use card through 07/2021. Insurance prefers Ajovy  or Emgality  but will not approve Botox  and CGRP. I have provided 2 sample pens, today. She was encouraged to contact [employer] to see if she can renew copay card and to speak with [facility] to make sure card was entered correctly. She will call me to update asap.   01/14/2021: She continues Amovig and verapamil. Botox  is very helpful. Rizatriptan  continues to be effective for abortive therapy. She has had a few more headaches with the weather changes but feels they are mild. She has lost 30 pounds on Optavia. Masseter injections are beneficial to reduce clenching.   10/16/2020 ALL: She is doing well, today. She was able to get Amovig with copay card. Botox  continues to be helpful. She also continues verapamil.  She has had occasional headaches but only 1 severe migraine over the past 3 months. Rizatriptan  works well for abortive therapy. She takes it 2-3 times per month, on average. She requests to continue masseter injections.   07/09/2020 ALL: She continues to do well. She did have more headaches with recent diagnosis of Covid and strep throat. Botox  does help. Amovig and verapamil also helps. Insurance will not cover both. She was getting medication using  copay card. Rizatriptan  helps with abortive therapy. She has started PT for acute torticollis. She does feel masseter injections help with clenching.  *Amovig sample (4) given with copay card  03/17/2020 ALL: She continues Amovig every 30 days, verapamil (rx by PCP) and rizatriptan  for abortive therapy. She  has had worsening headaches since last being seen. She has had some difficulty getting Botox  delivered. She has had to use rizatriptan  more frequently, recently. She was treated for sinus infection last week with abx and cough syrup. She is feeling a little better now. She has noted an unusual sensation of the left base of neck and head over the past month of being off Botox .     Consent Form Botulism Toxin Injection For Chronic Migraine    Reviewed orally with patient, additionally signature is on file:  Botulism toxin has been approved by the Federal drug administration for treatment of chronic migraine. Botulism toxin does not cure chronic migraine and it may not be effective in some patients.  The administration of botulism toxin is accomplished by injecting a small amount of toxin into the muscles of the neck and head. Dosage must be titrated for each individual. Any benefits resulting from botulism toxin tend to wear off after 3 months with a repeat injection required if benefit is to be maintained. Injections are usually done every 3-4 months with maximum effect peak achieved by about 2 or 3 weeks. Botulism toxin is expensive and you should be sure of what costs you will incur resulting from the injection.  The side effects of botulism toxin use for chronic migraine may include:   -Transient, and usually mild, facial weakness with facial injections  -Transient, and usually mild, head or neck weakness with head/neck injections  -Reduction or loss of forehead facial animation due to forehead muscle weakness  -Eyelid drooping  -Dry eye  -Pain at the site of injection or bruising at the site of injection  -Double vision  -Potential unknown long term risks   Contraindications: You should not have Botox  if you are pregnant, nursing, allergic to albumin, have an infection, skin condition, or muscle weakness at the site of the injection, or have myasthenia gravis, Lambert-Eaton syndrome, or  ALS.  It is also possible that as with any injection, there may be an allergic reaction or no effect from the medication. Reduced effectiveness after repeated injections is sometimes seen and rarely infection at the injection site may occur. All care will be taken to prevent these side effects. If therapy is given over a long time, atrophy and wasting in the muscle injected may occur. Occasionally the patient's become refractory to treatment because they develop antibodies to the toxin. In this event, therapy needs to be modified.  I have read the above information and consent to the administration of botulism toxin.    BOTOX  PROCEDURE NOTE FOR MIGRAINE HEADACHE  Contraindications and precautions discussed with patient(above). Aseptic procedure was observed and patient tolerated procedure. Procedure performed by Greig Forbes, FNP-C.   The condition has existed for more than 6 months, and pt does not have a diagnosis of ALS, Myasthenia Gravis or Lambert-Eaton Syndrome.  Risks and benefits of injections discussed and pt agrees to proceed with the procedure.  Written consent obtained  These injections are medically necessary. Pt  receives good benefits from these injections. These injections do not cause sedations or hallucinations which the oral therapies may cause.   Description of procedure:  The patient was placed in a sitting position.  The standard protocol was used for Botox  as follows, with 5 units of Botox  injected at each site:  -Procerus muscle, midline injection  -Corrugator muscle, bilateral injection  -Frontalis muscle, bilateral injection, with 2 sites each side, medial injection was performed in the upper one third of the frontalis muscle, in the region vertical from the medial inferior edge of the superior orbital rim. The lateral injection was again in the upper one third of the forehead vertically above the lateral limbus of the cornea, 1.5 cm lateral to the medial injection  site.  -Temporalis muscle injection, 4 sites, bilaterally. The first injection was 3 cm above the tragus of the ear, second injection site was 1.5 cm to 3 cm up from the first injection site in line with the tragus of the ear. The third injection site was 1.5-3 cm forward between the first 2 injection sites. The fourth injection site was 1.5 cm posterior to the second injection site. 5th site laterally in the temporalis  muscleat the level of the outer canthus.  -Occipitalis muscle injection, 3 sites, bilaterally. The first injection was done one half way between the occipital protuberance and the tip of the mastoid process behind the ear. The second injection site was done lateral and superior to the first, 1 fingerbreadth from the first injection. The third injection site was 1 fingerbreadth superiorly and medially from the first injection site.  -Cervical paraspinal muscle injection, 2 sites, bilaterally. The first injection site was 1 cm from the midline of the cervical spine, 3 cm inferior to the lower border of the occipital protuberance. The second injection site was 1.5 cm superiorly and laterally to the first injection site.  -Trapezius muscle injection was performed at 3 sites, bilaterally. The first injection site was in the upper trapezius muscle halfway between the inflection point of the neck, and the acromion. The second injection site was one half way between the acromion and the first injection site. The third injection was done between the first injection site and the inflection point of the neck.     Will return for repeat injection in 3 months.   A total of 200 units of Botox  was prepared, 155 units of Botox  was injected as documented above, 45 units of Botox  was wasted. The patient tolerated the procedure well, there were no complications of the above procedure.

## 2024-01-05 ENCOUNTER — Ambulatory Visit: Admitting: Family Medicine

## 2024-01-05 VITALS — BP 142/80 | HR 61 | Ht 62.0 in | Wt 182.6 lb

## 2024-01-05 DIAGNOSIS — G43711 Chronic migraine without aura, intractable, with status migrainosus: Secondary | ICD-10-CM

## 2024-01-05 MED ORDER — ONABOTULINUMTOXINA 200 UNITS IJ SOLR
15.0000 [IU] | Freq: Once | INTRAMUSCULAR | Status: AC
  Administered 2024-01-05: 155 [IU] via INTRAMUSCULAR

## 2024-01-05 NOTE — Progress Notes (Signed)
 Botox -200U x 1vial Lot: [id number] Expiration: 03/2026 NDC: 9976-6078-97  Bacteriostatic 0.9% Sodium Chloride- 4mL total Lot: [id number] Expiration: 03/16/2025 NDC: 9590-8033-97  Buy/Bill  Witnessed ab:Dpfnwz H,CMA Dx: H56.288

## 2024-03-21 ENCOUNTER — Telehealth: Payer: Self-pay | Admitting: Family Medicine

## 2024-03-21 MED ORDER — ONABOTULINUMTOXINA 200 UNITS IJ SOLR: 200.0000 [IU] | INTRAMUSCULAR | 3 refills | Status: AC

## 2024-03-21 NOTE — Telephone Encounter (Signed)
 Submitted request to [facility] for pt to be switched to SP, auth was approved. Please send rx to [facility].  Auth#: [id number] (03/21/24-05/16/25)

## 2024-03-21 NOTE — Telephone Encounter (Signed)
 refilled

## 2024-03-21 NOTE — Addendum Note (Signed)
 Addended by: ONEITA NEVELYN BRAVO on: 03/21/2024 10:58 AM   Modules accepted: Orders

## 2024-03-22 ENCOUNTER — Telehealth: Payer: Self-pay | Admitting: Family Medicine

## 2024-03-22 NOTE — Telephone Encounter (Signed)
 Albert from [facility] called to schedule BOTOX  delivery on 11/25 Units-200 90 day supply No signature required Via [employer]

## 2024-04-11 NOTE — Progress Notes (Signed)
 04/16/2024 ALL: Jenna Chang returns for Botox . She reports near daily headaches for the past month. Headaches have steadily worsened since stopped Amovig. Emgality  has not been effective. She averages 9-10 migraines per month. Rizatriptan  helps some. She has also taken Excedrin. She is being followed for MGUS.   We will switch Emgality  to Qulipta. 16 sample tablets and 4 sample tablets of Nurtec given in office, today.   01/05/2024 ALL: Jenna Chang returns for Botox . She was last seen by Harlene Bogaert, NP. She had recently started Emgality  and felt it may be helping some. Since, she reports doing fair. She is having regular migraines. Maybe 3 a week or so. Rizatriptan  works well but she usually needs two doses. She is currently being worked up for concerns of multiple myeloma.   10/06/2023 JM: patient returns for repeat botox  injection. Prior injection 07/14/2023 with Dr. Ines. Migraines previously very well controlled on Aimovig  and botox  but unfortunately insurance denied Aimovig . She was transitioned to Emgality  (first injection 5/12), has noticed some improvement of migraines already but still occurring. Advised giving Emgality  more time to take effect but if migraines persist, could consider trial of Ajovy  or restart of Aimovig  (based on insurance requirements). Tolerated procedure well today. Return in 3 months for repeat injection.   07/14/2023 AA: routine botox  protocol. She is doing well, almost complete resolution of migraines.   04/20/2023 ALL: Jenna Chang returns for Botox . She has been seeing Dr Ines for Botox  procedures. She continues Amovig, verapamil and rizatriptan . She reports migraines are well managed. She does not increased frequency about 2 weeks prior to Botox .   10/19/2021 ALL: Jenna Chang returns for Botox . She continues Amovig, verapamil and rizatriptan . She is doing well. She continues to work toward weight loss goal. She is about 11 pounds from goal of 140. She may have 1-2 migraines per month.  Easily aborted with Tylenol or rizatriptan .   07/21/2021 ALL: She returns for Botox . She continues Amovig and verapamil. Rizatriptan  continues to help with abortive therapy. No headaches since last visit. She is having trouble with severe dizziness. She reports BP was > 200/100 with PT during Epley maneuver. She has seen PCP. CT was unremarkable. 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 Meclizine not helping. She was referred to vestibular therapy and is planning to schedule soon. BP has been well managed.   04/21/2021 ALL: She returns for Botox . She continues Amovig and verapamil. Rizatriptan  helps with abortive therapy. She reports that she was not able to pick up Amovig rx from pharmacist this month. She is not sure what has changed but has been able to get Amovig using copay card since 07/2020. She was told she could use card through 07/2021. Insurance prefers Ajovy  or Emgality  but will not approve Botox  and CGRP. I have provided 2 sample pens, today. She was encouraged to contact [employer] to see if she can renew copay card and to speak with [facility] to make sure card was entered correctly. She will call me to update asap.   01/14/2021: She continues Amovig and verapamil. Botox  is very helpful. Rizatriptan  continues to be effective for abortive therapy. She has had a few more headaches with the weather changes but feels they are mild. She has lost 30 pounds on Optavia. Masseter injections are beneficial to reduce clenching.   10/16/2020 ALL: She is doing well, today. She was able to get Amovig with copay card. Botox  continues to be helpful. She also continues verapamil.  She has had occasional headaches but  only 1 severe migraine over the past 3 months. Rizatriptan  works well for abortive therapy. She takes it 2-3 times per month, on average. She requests to continue masseter injections.   07/09/2020 ALL: She continues to do well. She did have more headaches with recent diagnosis of Covid and strep throat.  Botox  does help. Amovig and verapamil also helps. Insurance will not cover both. She was getting medication using copay card. Rizatriptan  helps with abortive therapy. She has started PT for acute torticollis. She does feel masseter injections help with clenching.  *Amovig sample (4) given with copay card  03/17/2020 ALL: She continues Amovig every 30 days, verapamil (rx by PCP) and rizatriptan  for abortive therapy. She has had worsening headaches since last being seen. She has had some difficulty getting Botox  delivered. She has had to use rizatriptan  more frequently, recently. She was treated for sinus infection last week with abx and cough syrup. She is feeling a little better now. She has noted an unusual sensation of the left base of neck and head over the past month of being off Botox .     Consent Form Botulism Toxin Injection For Chronic Migraine    Reviewed orally with patient, additionally signature is on file:  Botulism toxin has been approved by the Federal drug administration for treatment of chronic migraine. Botulism toxin does not cure chronic migraine and it may not be effective in some patients.  The administration of botulism toxin is accomplished by injecting a small amount of toxin into the muscles of the neck and head. Dosage must be titrated for each individual. Any benefits resulting from botulism toxin tend to wear off after 3 months with a repeat injection required if benefit is to be maintained. Injections are usually done every 3-4 months with maximum effect peak achieved by about 2 or 3 weeks. Botulism toxin is expensive and you should be sure of what costs you will incur resulting from the injection.  The side effects of botulism toxin use for chronic migraine may include:   -Transient, and usually mild, facial weakness with facial injections  -Transient, and usually mild, head or neck weakness with head/neck injections  -Reduction or loss of forehead facial animation  due to forehead muscle weakness  -Eyelid drooping  -Dry eye  -Pain at the site of injection or bruising at the site of injection  -Double vision  -Potential unknown long term risks   Contraindications: You should not have Botox  if you are pregnant, nursing, allergic to albumin, have an infection, skin condition, or muscle weakness at the site of the injection, or have myasthenia gravis, Lambert-Eaton syndrome, or ALS.  It is also possible that as with any injection, there may be an allergic reaction or no effect from the medication. Reduced effectiveness after repeated injections is sometimes seen and rarely infection at the injection site may occur. All care will be taken to prevent these side effects. If therapy is given over a long time, atrophy and wasting in the muscle injected may occur. Occasionally the patient's become refractory to treatment because they develop antibodies to the toxin. In this event, therapy needs to be modified.  I have read the above information and consent to the administration of botulism toxin.    BOTOX  PROCEDURE NOTE FOR MIGRAINE HEADACHE  Contraindications and precautions discussed with patient(above). Aseptic procedure was observed and patient tolerated procedure. Procedure performed by Greig Forbes, FNP-C.   The condition has existed for more than 6 months, and pt does not have  a diagnosis of ALS, Myasthenia Gravis or Lambert-Eaton Syndrome.  Risks and benefits of injections discussed and pt agrees to proceed with the procedure.  Written consent obtained  These injections are medically necessary. Pt  receives good benefits from these injections. These injections do not cause sedations or hallucinations which the oral therapies may cause.   Description of procedure:  The patient was placed in a sitting position. The standard protocol was used for Botox  as follows, with 5 units of Botox  injected at each site:  -Procerus muscle, midline  injection  -Corrugator muscle, bilateral injection  -Frontalis muscle, bilateral injection, with 2 sites each side, medial injection was performed in the upper one third of the frontalis muscle, in the region vertical from the medial inferior edge of the superior orbital rim. The lateral injection was again in the upper one third of the forehead vertically above the lateral limbus of the cornea, 1.5 cm lateral to the medial injection site.  -Temporalis muscle injection, 4 sites, bilaterally. The first injection was 3 cm above the tragus of the ear, second injection site was 1.5 cm to 3 cm up from the first injection site in line with the tragus of the ear. The third injection site was 1.5-3 cm forward between the first 2 injection sites. The fourth injection site was 1.5 cm posterior to the second injection site. 5th site laterally in the temporalis  muscleat the level of the outer canthus.  -Occipitalis muscle injection, 3 sites, bilaterally. The first injection was done one half way between the occipital protuberance and the tip of the mastoid process behind the ear. The second injection site was done lateral and superior to the first, 1 fingerbreadth from the first injection. The third injection site was 1 fingerbreadth superiorly and medially from the first injection site.  -Cervical paraspinal muscle injection, 2 sites, bilaterally. The first injection site was 1 cm from the midline of the cervical spine, 3 cm inferior to the lower border of the occipital protuberance. The second injection site was 1.5 cm superiorly and laterally to the first injection site.  -Trapezius muscle injection was performed at 3 sites, bilaterally. The first injection site was in the upper trapezius muscle halfway between the inflection point of the neck, and the acromion. The second injection site was one half way between the acromion and the first injection site. The third injection was done between the first injection  site and the inflection point of the neck.   -Masseter muscle injections bilaterally   Will return for repeat injection in 3 months.   A total of 200 units of Botox  was prepared, 155 units of Botox  was injected as documented above, 45 units of Botox  was wasted. The patient tolerated the procedure well, there were no complications of the above procedure.

## 2024-04-16 ENCOUNTER — Encounter: Payer: Self-pay | Admitting: Family Medicine

## 2024-04-16 ENCOUNTER — Ambulatory Visit: Admitting: Family Medicine

## 2024-04-16 VITALS — BP 168/92

## 2024-04-16 DIAGNOSIS — G43711 Chronic migraine without aura, intractable, with status migrainosus: Secondary | ICD-10-CM

## 2024-04-16 MED ORDER — QULIPTA 60 MG PO TABS: 60.0000 mg | ORAL_TABLET | Freq: Every day | ORAL | 3 refills | Status: AC

## 2024-04-16 MED ORDER — RIZATRIPTAN BENZOATE 10 MG PO TBDP: ORAL_TABLET | ORAL | 11 refills | Status: AC

## 2024-04-16 MED ORDER — ONABOTULINUMTOXINA 200 UNITS IJ SOLR
155.0000 [IU] | Freq: Once | INTRAMUSCULAR | Status: AC
  Administered 2024-04-16: 165 [IU] via INTRAMUSCULAR

## 2024-04-16 NOTE — Progress Notes (Signed)
 Botox - 200 units x 1 vial Lot: [id number] Expiration: 2028/03 NDC: 0023-3921-02  Bacteriostatic 0.9% Sodium Chloride- 4 mL  Lot: [id number] Expiration: 03/16/25 NDC: 9590803397  Dx: H56.288  S/P  Witnessed by [facility] RMA

## 2024-04-17 ENCOUNTER — Telehealth: Payer: Self-pay | Admitting: Family Medicine

## 2024-04-17 NOTE — Telephone Encounter (Signed)
 Pharmasit  from [facility]  called to verify that pt is not taking  Atogepant (QULIPTA) 60 MG TABS  and Emagality together . Informed that Pt is only prescribe  Atogepant (QULIPTA) 60 MG TABS  at this time . However will follow up with Nurse to make sure Pt is not taking  both medication at the same time    [id number] - [place], [place] - [address] Phone: [phone]  Fax: [phone]

## 2024-04-17 NOTE — Telephone Encounter (Signed)
 Called Jenna Chang at [phone] and spoke w/ Delon. Confirmed Emgality  d/c'd yesterday and pt prescribed Qulipta instead. She cx remaining refills on file for Emgality . Nothing further needed.

## 2024-04-17 NOTE — Telephone Encounter (Signed)
 Per AL,NP last note:   04/16/2024 ALL: Jenna Chang returns for Botox . She reports near daily headaches for the past month. Headaches have steadily worsened since stopped Amovig. Emgality  has not been effective. She averages 9-10 migraines per month. Rizatriptan  helps some. She has also taken Excedrin. She is being followed for MGUS.    We will switch Emgality  to Qulipta. 16 sample tablets and 4 sample tablets of Nurtec given in office, today.

## 2024-05-16 ENCOUNTER — Encounter: Payer: Self-pay | Admitting: Family Medicine

## 2024-07-16 ENCOUNTER — Ambulatory Visit: Admitting: Family Medicine
